# Patient Record
Sex: Female | Born: 1975 | Race: Black or African American | Hispanic: No | Marital: Single | Smoking: Current every day smoker
Health system: Southern US, Community
[De-identification: ages and names within clinical notes are randomized; demographics above are authoritative.]

## PROBLEM LIST (undated history)

## (undated) DIAGNOSIS — R569 Unspecified convulsions: Secondary | ICD-10-CM

## (undated) DIAGNOSIS — F1010 Alcohol abuse, uncomplicated: Secondary | ICD-10-CM

## (undated) DIAGNOSIS — K7460 Unspecified cirrhosis of liver: Secondary | ICD-10-CM

## (undated) DIAGNOSIS — J45909 Unspecified asthma, uncomplicated: Secondary | ICD-10-CM

---

## 2012-01-25 ENCOUNTER — Emergency Department (INDEPENDENT_AMBULATORY_CARE_PROVIDER_SITE_OTHER): Payer: Self-pay

## 2012-01-25 ENCOUNTER — Encounter (HOSPITAL_BASED_OUTPATIENT_CLINIC_OR_DEPARTMENT_OTHER): Payer: Self-pay | Admitting: *Deleted

## 2012-01-25 ENCOUNTER — Emergency Department (HOSPITAL_BASED_OUTPATIENT_CLINIC_OR_DEPARTMENT_OTHER)
Admission: EM | Admit: 2012-01-25 | Discharge: 2012-01-25 | Disposition: A | Discharge status: Home / Self Care | Payer: Self-pay | Attending: Emergency Medicine | Admitting: Emergency Medicine

## 2012-01-25 DIAGNOSIS — Z0389 Encounter for observation for other suspected diseases and conditions ruled out: Secondary | ICD-10-CM

## 2012-01-25 DIAGNOSIS — IMO0002 Reserved for concepts with insufficient information to code with codable children: Secondary | ICD-10-CM | POA: Insufficient documentation

## 2012-01-25 DIAGNOSIS — W268XXA Contact with other sharp object(s), not elsewhere classified, initial encounter: Secondary | ICD-10-CM | POA: Insufficient documentation

## 2012-01-25 DIAGNOSIS — S91309A Unspecified open wound, unspecified foot, initial encounter: Secondary | ICD-10-CM

## 2012-01-25 DIAGNOSIS — S90851A Superficial foreign body, right foot, initial encounter: Secondary | ICD-10-CM

## 2012-01-25 MED ORDER — LIDOCAINE HCL 2 % IJ SOLN: 5.0000 mL | Freq: Once | INTRAMUSCULAR | Status: DC

## 2012-01-25 MED ORDER — LIDOCAINE HCL (PF) 1 % IJ SOLN: 5.0000 mL | Freq: Once | INTRAMUSCULAR | Status: DC

## 2012-01-25 MED ORDER — LIDOCAINE HCL 2 % IJ SOLN
INTRAMUSCULAR | Status: AC
  Filled 2012-01-25: qty 1
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
## 2012-01-25 NOTE — ED Provider Notes (Signed)
Medical screening examination/treatment/procedure(s) were performed by non-physician practitioner and as supervising physician I was immediately available for consultation/collaboration.   Forbes Cellar, MD 01/25/12 [phone]

## 2012-01-25 NOTE — Discharge Instructions (Signed)
Foreign Body  A foreign body is something in your body that should not be there. This may have been caused by a puncture wound or other injury. Puncture wounds become easily infected. This happens when bacteria (germs) get under the skin. Rusty nails and similar foreign bodies are often dirty and carry germs on them.   TREATMENT    A foreign body is usually removed if this can be easily done right after it happens.   Sometimes they are left in and removed at a later surgery. They may be left in indefinitely if they will not cause later problems.   The following are general instructions in caring for your wound.  HOME CARE INSTRUCTIONS    A dressing, depending on the location of the wound, may have been applied. This may be changed once per day or as instructed. If the dressing sticks, it may be soaked off with soapy water or hydrogen peroxide.   Only take over-the-counter or prescription medicines for pain, discomfort, or fever as directed by your caregiver.   Be aware that your body will work to remove the foreign substance. That is, the foreign body may work itself out of the wound. That is normal.   You may have received a recommendation to follow up with your physician or a specialist. It is very important to call for or keep follow-up appointments in order to avoid infection or other complications.  SEEK IMMEDIATE MEDICAL CARE IF:    There is redness, swelling, or increasing pain in the wound.   You notice a foul smell coming from the wound or dressing.   Pus is coming from the wound.   An unexplained oral temperature above 102 F (38.9 C) develops, or as your caregiver suggests.   There is increasing pain in the wound.  If you did not receive a tetanus shot today because you did not recall when your last one was given, check with your caregiver's office and determine if one is needed. Generally for a "dirty" wound, you should receive a tetanus booster if you have not had one in the last five  years. If you have a "clean" wound, you should receive a tetanus booster if you have not had one within the last ten years.  If you have a foreign body that needs removal and this was not done today, make sure you know how you are to follow up and what is the plan of action for taking care of this. It is your responsibility to follow up on this.  MAKE SURE YOU:    Understand these instructions.   Will watch your condition.   Will get help right away if you are not doing well or get worse.  Document Released: 03/10/2001 Document Revised: 09/03/2011 Document Reviewed: 05/03/2008  [employer] Patient Information 2012 [employer].
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
## 2012-09-19 ENCOUNTER — Encounter (HOSPITAL_BASED_OUTPATIENT_CLINIC_OR_DEPARTMENT_OTHER): Payer: Self-pay | Admitting: *Deleted

## 2012-09-19 ENCOUNTER — Emergency Department (HOSPITAL_BASED_OUTPATIENT_CLINIC_OR_DEPARTMENT_OTHER)
Admission: EM | Admit: 2012-09-19 | Discharge: 2012-09-19 | Disposition: A | Discharge status: Home / Self Care | Payer: Self-pay | Attending: Emergency Medicine | Admitting: Emergency Medicine

## 2012-09-19 DIAGNOSIS — Y929 Unspecified place or not applicable: Secondary | ICD-10-CM | POA: Insufficient documentation

## 2012-09-19 DIAGNOSIS — W503XXA Accidental bite by another person, initial encounter: Secondary | ICD-10-CM

## 2012-09-19 DIAGNOSIS — Z23 Encounter for immunization: Secondary | ICD-10-CM | POA: Insufficient documentation

## 2012-09-19 DIAGNOSIS — W268XXA Contact with other sharp object(s), not elsewhere classified, initial encounter: Secondary | ICD-10-CM | POA: Insufficient documentation

## 2012-09-19 DIAGNOSIS — Y939 Activity, unspecified: Secondary | ICD-10-CM | POA: Insufficient documentation

## 2012-09-19 DIAGNOSIS — S0180XA Unspecified open wound of other part of head, initial encounter: Secondary | ICD-10-CM | POA: Insufficient documentation

## 2012-09-19 MED ORDER — AMOXICILLIN-POT CLAVULANATE 875-125 MG PO TABS: 1.0000 | ORAL_TABLET | Freq: Two times a day (BID) | ORAL | Status: DC

## 2012-09-19 MED ORDER — TETANUS-DIPHTH-ACELL PERTUSSIS 5-2.5-18.5 LF-MCG/0.5 IM SUSP
0.5000 mL | Freq: Once | INTRAMUSCULAR | Status: AC
  Administered 2012-09-19: 0.5 mL via INTRAMUSCULAR
  Filled 2012-09-19: qty 0.5
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
## 2012-09-24 NOTE — ED Provider Notes (Signed)
History/physical exam/procedure(s) were performed by non-physician practitioner and as supervising physician I was immediately available for consultation/collaboration. I have reviewed all notes and am in agreement with care and plan.   Hilario Quarry, MD 09/24/12 [phone]

## 2012-12-22 ENCOUNTER — Encounter (HOSPITAL_COMMUNITY): Payer: Self-pay | Admitting: Emergency Medicine

## 2012-12-22 ENCOUNTER — Emergency Department (HOSPITAL_COMMUNITY)
Admission: EM | Admit: 2012-12-22 | Discharge: 2012-12-22 | Disposition: A | Discharge status: Home / Self Care | Payer: Self-pay | Attending: Emergency Medicine | Admitting: Emergency Medicine

## 2012-12-22 DIAGNOSIS — T783XXA Angioneurotic edema, initial encounter: Secondary | ICD-10-CM | POA: Insufficient documentation

## 2012-12-22 DIAGNOSIS — I10 Essential (primary) hypertension: Secondary | ICD-10-CM | POA: Insufficient documentation

## 2012-12-22 MED ORDER — PREDNISONE 50 MG PO TABS: 50.0000 mg | ORAL_TABLET | Freq: Every day | ORAL | Status: DC

## 2012-12-22 MED ORDER — DIPHENHYDRAMINE HCL 50 MG/ML IJ SOLN
25.0000 mg | Freq: Once | INTRAMUSCULAR | Status: AC
  Administered 2012-12-22: 25 mg via INTRAVENOUS
  Filled 2012-12-22: qty 1

## 2012-12-22 MED ORDER — FAMOTIDINE 20 MG PO TABS: 20.0000 mg | ORAL_TABLET | Freq: Two times a day (BID) | ORAL | Status: DC

## 2012-12-22 MED ORDER — FAMOTIDINE IN NACL 20-0.9 MG/50ML-% IV SOLN
20.0000 mg | Freq: Once | INTRAVENOUS | Status: AC
  Administered 2012-12-22: 20 mg via INTRAVENOUS
  Filled 2012-12-22: qty 50

## 2012-12-22 MED ORDER — METHYLPREDNISOLONE SODIUM SUCC 125 MG IJ SOLR
125.0000 mg | Freq: Once | INTRAMUSCULAR | Status: AC
  Administered 2012-12-22: 125 mg via INTRAVENOUS
  Filled 2012-12-22: qty 2

## 2012-12-22 MED ORDER — DIPHENHYDRAMINE HCL 25 MG PO TABS: 50.0000 mg | ORAL_TABLET | Freq: Four times a day (QID) | ORAL | Status: DC

## 2012-12-22 NOTE — ED Notes (Signed)
Spoke with pt and family at bedside about plan of care for pt; pt understands that she will need to stay for at least 4 hours for observation per Dr Dellie Burns orders

## 2012-12-22 NOTE — ED Notes (Signed)
Pt given ice chips and explained why she can not have anything solid to eat at this time; pt states she understands why she can't eat and is ok with eating ice chips right now; pt alert and mentating appropriately; pt speaking in clear sentences; NAD noted at this time

## 2012-12-22 NOTE — ED Notes (Signed)
Pt given d/c teaching via Chrisandra Netters, RN; pt ambualtory leaving ED with d/c paperwork and prescriptions; pt instructed not to drive and endorses that she has a ride coming to pick her up from ED and will not be driving home; NAD notes upon d/c.

## 2012-12-22 NOTE — ED Provider Notes (Signed)
History     CSN: [id number]  Arrival date & time 12/22/12  1757   First MD Initiated Contact with Patient 12/22/12 1759      Chief Complaint  Patient presents with  . Angioedema    (Consider location/radiation/quality/duration/timing/severity/associated sxs/prior treatment) HPI Pt presenting with tongue swelling and difficulty speaking.  Pt states symptoms began earlier today.  She also states she took an unknown medicaiton to help her sleep and woke up with tongue swollen.  States she does not take any daily medications.  No hx of similar symptoms in the past.  No rash or itching.  No difficulty breathing or swallowing.  She was brought in by EMS, has had no treatment prior to arrival. There are no other associated systemic symptoms, there are no other alleviating or modifying factors.   History reviewed. No pertinent past medical history.  Past Surgical History  Procedure Laterality Date  . Cesarean section      History reviewed. No pertinent family history.  History  Substance Use Topics  . Smoking status: Never Smoker   . Smokeless tobacco: Not on file  . Alcohol Use: Yes    OB History   Grav Para Term Preterm Abortions TAB SAB Ect Mult Living                  Review of Systems ROS reviewed and all otherwise negative except for mentioned in HPI  Allergies  Review of patient's allergies indicates no known allergies.  Home Medications   Current Outpatient Rx  Name  Route  Sig  Dispense  Refill  . amoxicillin-clavulanate (AUGMENTIN) 875-125 MG per tablet   Oral   Take 1 tablet by mouth every 12 (twelve) hours.   10 tablet   0   . diphenhydrAMINE (BENADRYL) 25 MG tablet   Oral   Take 2 tablets (50 mg total) by mouth every 6 (six) hours. Take 1-2 tablets every 6 hours x 2 days, then space out to an as needed basis   20 tablet   0   . famotidine (PEPCID) 20 MG tablet   Oral   Take 1 tablet (20 mg total) by mouth 2 (two) times daily.   30 tablet   0    . predniSONE (DELTASONE) 50 MG tablet   Oral   Take 1 tablet (50 mg total) by mouth daily.   10 tablet   0     BP 127/76  Pulse 98  Temp(Src) 98.6 F (37 C) (Oral)  Resp 20  SpO2 100% Vitals reviewed Physical Exam Physical Examination: General appearance - alert, well appearing, and in no distress Mental status - alert, oriented to person, place, and time Eyes - no conjunctival injection, no scleral icterus Mouth - mucous membranes moist, pharynx normal without lesions Neck - supple, no significant adenopathy Chest - clear to auscultation, no wheezes, rales or rhonchi, symmetric air entry Heart - normal rate, regular rhythm, normal S1, S2, no murmurs, rubs, clicks or gallops Abdomen - soft, nontender, nondistended, no masses or organomegaly Extremities - peripheral pulses normal, no pedal edema, no clubbing or cyanosis Skin - normal coloration and turgor, no rashes  ED Course  Procedures (including critical care time)  6:53 PM pt is already having some improvement in her symptoms.  Speech is more clear and she feels more comfortable.   8:39 PM pt continues to feel improvement, talking and breathing easily  Labs Reviewed - No data to display No results found.   1. Angioedema,  initial encounter       MDM  Pt presents with c/o tongue swelling, exam c/w mild angioedema- pt responded to IV meds and was observed in the ED for several hours afterward without recurrence of symptoms.  Discharged to continue steroids, benadryl, pepcid.  Discharged with strict return precautions.  Pt agreeable with plan.        Ethelda Chick, MD 12/24/12 [phone]

## 2012-12-22 NOTE — ED Notes (Signed)
Pt able to speak in clear sentences at the moment; pt states she is able to speak easier now; pt denies dizziness and lightheartedness; pt denies numbness and tingling; pt denies blurred vision and headache; pt alert and mentating appropriately
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
## 2012-12-22 NOTE — ED Notes (Signed)
Pt resting in bed; pt denies difficulty breathing; pt denies difficulty speaking; pt states her throat feels scratchy; NAD noted at this time

## 2013-05-28 ENCOUNTER — Emergency Department (HOSPITAL_COMMUNITY)
Admission: EM | Admit: 2013-05-28 | Discharge: 2013-05-29 | Payer: Self-pay | Attending: Emergency Medicine | Admitting: Emergency Medicine

## 2013-05-28 ENCOUNTER — Encounter (HOSPITAL_COMMUNITY): Payer: Self-pay

## 2013-05-28 DIAGNOSIS — R569 Unspecified convulsions: Secondary | ICD-10-CM

## 2013-05-28 DIAGNOSIS — Z3202 Encounter for pregnancy test, result negative: Secondary | ICD-10-CM | POA: Insufficient documentation

## 2013-05-28 DIAGNOSIS — G40909 Epilepsy, unspecified, not intractable, without status epilepticus: Secondary | ICD-10-CM | POA: Insufficient documentation

## 2013-05-28 HISTORY — DX: Unspecified convulsions: R56.9

## 2013-05-28 HISTORY — DX: Unspecified asthma, uncomplicated: J45.909

## 2013-05-28 LAB — POCT I-STAT, CHEM 8
Calcium, Ion: 1.15 mmol/L (ref 1.12–1.23)
Chloride: 103 mEq/L (ref 96–112)
HCT: 30 % — ABNORMAL LOW (ref 36.0–46.0)

## 2013-05-28 MED ORDER — ONDANSETRON 4 MG PO TBDP
4.0000 mg | ORAL_TABLET | Freq: Once | ORAL | Status: AC
  Administered 2013-05-28: 4 mg via ORAL
  Filled 2013-05-28: qty 1

## 2013-05-28 MED ORDER — LORAZEPAM 2 MG/ML IJ SOLN
INTRAMUSCULAR | Status: AC
  Administered 2013-05-28: 2 mg
  Filled 2013-05-28: qty 1

## 2013-05-28 MED ORDER — SODIUM CHLORIDE 0.9 % IV SOLN
1000.0000 mg | Freq: Once | INTRAVENOUS | Status: AC
  Administered 2013-05-28: 1000 mg via INTRAVENOUS
  Filled 2013-05-28: qty 20
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
## 2013-05-28 NOTE — ED Notes (Signed)
Pt with episode of unresponsiveness.  Pt noted to be shaking with eyes close.  Eye movement noted.  This RN notified of pt's activity, EDP made aware.  Pt currently responsive with eyes open and stating she is "not feeling well".  VSS.

## 2013-05-28 NOTE — ED Notes (Signed)
Pt with confusion.  Sts she does not recall episode of shaking and twitching.

## 2013-05-28 NOTE — ED Notes (Signed)
Dr.Camilo at bedside  

## 2013-05-28 NOTE — ED Notes (Signed)
2312 - Pt started to shaking in bed and become unresponsive.  Pt moaning and turning her head away from ammonia capsule.  HR increased to the 120's, O2 sats 100%, airway intact.  Orson Slick, PA and Fredderick Phenix, MD made aware of seizure like activity.  Verbal order given for 2 mg of Ativan.  Ativan given at 2314.  Pt still shaking and nonverbal.  Dilantin continued.  Neuro to be consulted.  Pt stopped seizure-like activity at 2321.  HR back in the 90's.

## 2013-05-28 NOTE — ED Provider Notes (Signed)
CSN: [id number]     Arrival date & time 05/28/13  2128 History   First MD Initiated Contact with Patient 05/28/13 2142     Chief Complaint  Patient presents with  . Seizures   HPI  History provided by the patient and [employer] Patient is a 36 year old female who reports a history of seizure disorder and presents after having 2 seizures in jail. Patient has been in jail for the past week. She does not recall the events but was reportedly found having contractures similar to a seizure lasting approximately 15 minutes. There was a temporary break followed by some confusion and then patient had a second seizure as reported by the [employer]. Patient was given 5 mg of Versed which stopped a second seizure. Patient has been stable with good airway since that time. Symptoms were associated with postictal confusion. There was no urinary or fecal incontinence. Patient did fall down and complains of some pain and soreness around her left forehead. She denies any pain or injuries to the extremities. Denies neck or back pain. Currently she feels improved without much complaint. No other treatments provided. Patient states she used to be on seizure medication but has not taken for over a year. Her last seizure was in February. No other aggravating or alleviating factors. No other associated symptoms.     Past Medical History  Diagnosis Date  . Seizures    Past Surgical History  Procedure Laterality Date  . Cesarean section     History reviewed. No pertinent family history. History  Substance Use Topics  . Smoking status: Never Smoker   . Smokeless tobacco: Not on file  . Alcohol Use: Yes   OB History   Grav Para Term Preterm Abortions TAB SAB Ect Mult Living                 Review of Systems  Constitutional: Negative for fever.  HENT: Negative for neck pain.   Eyes: Negative for visual disturbance.  Respiratory: Negative for cough and shortness of breath.   Gastrointestinal:  Negative for nausea, vomiting, abdominal pain and diarrhea.  Genitourinary: Negative for dysuria.  Musculoskeletal: Negative for back pain.  Neurological: Positive for headaches. Negative for dizziness and light-headedness.  All other systems reviewed and are negative.    Allergies  Review of patient's allergies indicates no known allergies.  Home Medications   Current Outpatient Rx  Name  Route  Sig  Dispense  Refill  . amoxicillin-clavulanate (AUGMENTIN) 875-125 MG per tablet   Oral   Take 1 tablet by mouth every 12 (twelve) hours.   10 tablet   0   . diphenhydrAMINE (BENADRYL) 25 MG tablet   Oral   Take 2 tablets (50 mg total) by mouth every 6 (six) hours. Take 1-2 tablets every 6 hours x 2 days, then space out to an as needed basis   20 tablet   0   . famotidine (PEPCID) 20 MG tablet   Oral   Take 1 tablet (20 mg total) by mouth 2 (two) times daily.   30 tablet   0   . predniSONE (DELTASONE) 50 MG tablet   Oral   Take 1 tablet (50 mg total) by mouth daily.   10 tablet   0    BP 122/71  Pulse 77  Temp(Src) 99.5 F (37.5 C) (Oral)  SpO2 100% Physical Exam  Nursing note and vitals reviewed. Constitutional: She is oriented to person, place, and time. She appears well-developed  and well-nourished. No distress.  HENT:  Head: Normocephalic.  Nose: Nose normal.  Mouth/Throat: Oropharynx is clear and moist.  Mild TTP over left forehead.  No significant hematoma.  No damage of skin.  No step offs.   No bite marks the tongue. Small possible minor bite mark to the right upper lip without bleeding or gaping wound. Dentition intact without acute injury.  Eyes: Conjunctivae and EOM are normal. Pupils are equal, round, and reactive to light.  Neck: Normal range of motion. Neck supple.  No meningeal signs  Cardiovascular: Normal rate and regular rhythm.   No murmur heard. Pulmonary/Chest: Effort normal and breath sounds normal. No respiratory distress. She has no  wheezes. She has no rales.  Abdominal: Soft. There is no tenderness. There is no rigidity, no rebound, no guarding, no CVA tenderness and no tenderness at McBurney's point.  Musculoskeletal: Normal range of motion. She exhibits no edema and no tenderness.       Cervical back: Normal.       Thoracic back: Normal.       Lumbar back: Normal.  Neurological: She is alert and oriented to person, place, and time. She has normal strength. No cranial nerve deficit or sensory deficit. Gait normal.  Skin: Skin is warm and dry. No rash noted.  Psychiatric: She has a normal mood and affect. Her behavior is normal.    ED Course  Procedures   Results for orders placed during the hospital encounter of 05/28/13  URINALYSIS, ROUTINE W REFLEX MICROSCOPIC      Result Value Range   Color, Urine YELLOW  YELLOW   APPearance CLOUDY (*) CLEAR   Specific Gravity, Urine 1.014  1.005 - 1.030   pH 7.0  5.0 - 8.0   Glucose, UA NEGATIVE  NEGATIVE mg/dL   Hgb urine dipstick NEGATIVE  NEGATIVE   Bilirubin Urine NEGATIVE  NEGATIVE   Ketones, ur 15 (*) NEGATIVE mg/dL   Protein, ur NEGATIVE  NEGATIVE mg/dL   Urobilinogen, UA 0.2  0.0 - 1.0 mg/dL   Nitrite NEGATIVE  NEGATIVE   Leukocytes, UA SMALL (*) NEGATIVE  URINE MICROSCOPIC-ADD ON      Result Value Range   Squamous Epithelial / LPF FEW (*) RARE   WBC, UA 3-6  <3 WBC/hpf   RBC / HPF 0-2  <3 RBC/hpf   Bacteria, UA FEW (*) RARE   Casts HYALINE CASTS (*) NEGATIVE   Urine-Other MUCOUS PRESENT    POCT I-STAT, CHEM 8      Result Value Range   Sodium 139  135 - 145 mEq/L   Potassium 4.1  3.5 - 5.1 mEq/L   Chloride 103  96 - 112 mEq/L   BUN 5 (*) 6 - 23 mg/dL   Creatinine, Ser 9.14  0.50 - 1.10 mg/dL   Glucose, Bld 86  70 - 99 mg/dL   Calcium, Ion 7.82  9.56 - 1.23 mmol/L   TCO2 26  0 - 100 mmol/L   Hemoglobin 10.2 (*) 12.0 - 15.0 g/dL   HCT 21.3 (*) 08.6 - 57.8 %  POCT PREGNANCY, URINE      Result Value Range   Preg Test, Ur NEGATIVE  NEGATIVE      Imaging Review No results found.  MDM   1. Seizure     9:50PM patient seen and evaluated. Patient well-appearing, awake and alert x3. Reports slight left headache and tenderness to the forehead.  Patient discussed with attending physician. Patient reports no history of seizure disorder  but unsure of medications. She does not recognize the names of Keppra, Dilantin, or Depakote. She does report a prior history of having a procedure described similar to an EEG. Currently we'll plan to start patient on Dilantin.  Spoke with Revonda Standard with pharmacy.  Will give loading dose of 1,000mg  IV Dilantin and maintenance dose of 300 mg extended release at bedtime.  11:20PM nursing four-minute patient having seizure-like activity. Upon entering the room patient is not responding verbally. Eyes are open without fasciculations. If she is having slight fasciculations and shaking of the upper extremities. Heart rate increased into the 120s. Hands are slightly clammy. Fasciculations in extremities do seem to go away withholding extremity. Return with letting go.  Pt also averts head to smelling salt.  Attending physician also in to see pt. patient without any immediate response to 2 mg Ativan. Total episode lasting for 5 minutes. 02 sats maintained. Patient did not seem to have significant postictal confusion.  Spoke with Dr.Camilo with neurology. He will be to see patient.  Dr. Leroy Kennedy has seen and evaluated pt.  He feels pts hx is questionable about seizure.  He agrees with plan to finish Dilantin and continue 300 mg at night.  He feels she is safe to return to jail.  I will also plan to give neurology referral.   Angus Seller, PA-C 05/29/13 [id number]

## 2013-05-29 DIAGNOSIS — R569 Unspecified convulsions: Secondary | ICD-10-CM

## 2013-05-29 LAB — URINALYSIS, ROUTINE W REFLEX MICROSCOPIC
Ketones, ur: 15 mg/dL — AB
Nitrite: NEGATIVE
Specific Gravity, Urine: 1.014 (ref 1.005–1.030)
pH: 7 (ref 5.0–8.0)

## 2013-05-29 LAB — POCT PREGNANCY, URINE: Preg Test, Ur: NEGATIVE

## 2013-05-29 LAB — URINE MICROSCOPIC-ADD ON

## 2013-05-29 MED ORDER — PHENYTOIN SODIUM EXTENDED 300 MG PO CAPS: 300.0000 mg | ORAL_CAPSULE | Freq: Every day | ORAL | Status: DC

## 2013-05-29 NOTE — ED Notes (Signed)
Water given to pt.  Tolerating PO fluids.

## 2013-05-30 LAB — URINE CULTURE: Colony Count: 55000

## 2013-05-30 NOTE — ED Provider Notes (Signed)
Medical screening examination/treatment/procedure(s) were conducted as a shared visit with non-physician practitioner(s) and myself.  I personally evaluated the patient during the encounter Pt seen with seizure activity.  Seizure noted in ED, but suspicious for pseudoseizure.  Neuro consulted.  Rolan Bucco, MD 05/30/13 1409

## 2021-08-06 ENCOUNTER — Encounter (HOSPITAL_BASED_OUTPATIENT_CLINIC_OR_DEPARTMENT_OTHER): Payer: Self-pay | Admitting: *Deleted

## 2021-08-06 ENCOUNTER — Emergency Department (HOSPITAL_BASED_OUTPATIENT_CLINIC_OR_DEPARTMENT_OTHER): Payer: Self-pay

## 2021-08-06 ENCOUNTER — Other Ambulatory Visit: Payer: Self-pay

## 2021-08-06 ENCOUNTER — Inpatient Hospital Stay (HOSPITAL_BASED_OUTPATIENT_CLINIC_OR_DEPARTMENT_OTHER)
Admission: EM | Admit: 2021-08-06 | Discharge: 2021-08-16 | DRG: 371 | Disposition: A | Discharge status: Home / Self Care | Payer: Self-pay | Attending: Internal Medicine | Admitting: Internal Medicine

## 2021-08-06 DIAGNOSIS — F10229 Alcohol dependence with intoxication, unspecified: Secondary | ICD-10-CM | POA: Diagnosis present

## 2021-08-06 DIAGNOSIS — G9341 Metabolic encephalopathy: Secondary | ICD-10-CM | POA: Diagnosis present

## 2021-08-06 DIAGNOSIS — D509 Iron deficiency anemia, unspecified: Secondary | ICD-10-CM | POA: Diagnosis present

## 2021-08-06 DIAGNOSIS — A0472 Enterocolitis due to Clostridium difficile, not specified as recurrent: Principal | ICD-10-CM | POA: Diagnosis present

## 2021-08-06 DIAGNOSIS — G40909 Epilepsy, unspecified, not intractable, without status epilepticus: Secondary | ICD-10-CM | POA: Diagnosis present

## 2021-08-06 DIAGNOSIS — E860 Dehydration: Secondary | ICD-10-CM | POA: Diagnosis present

## 2021-08-06 DIAGNOSIS — F1010 Alcohol abuse, uncomplicated: Secondary | ICD-10-CM | POA: Diagnosis present

## 2021-08-06 DIAGNOSIS — Z781 Physical restraint status: Secondary | ICD-10-CM

## 2021-08-06 DIAGNOSIS — R9431 Abnormal electrocardiogram [ECG] [EKG]: Secondary | ICD-10-CM

## 2021-08-06 DIAGNOSIS — Y908 Blood alcohol level of 240 mg/100 ml or more: Secondary | ICD-10-CM | POA: Diagnosis present

## 2021-08-06 DIAGNOSIS — E872 Acidosis, unspecified: Secondary | ICD-10-CM | POA: Diagnosis present

## 2021-08-06 DIAGNOSIS — E876 Hypokalemia: Secondary | ICD-10-CM | POA: Diagnosis present

## 2021-08-06 DIAGNOSIS — R7401 Elevation of levels of liver transaminase levels: Secondary | ICD-10-CM | POA: Diagnosis present

## 2021-08-06 DIAGNOSIS — I110 Hypertensive heart disease with heart failure: Secondary | ICD-10-CM | POA: Diagnosis present

## 2021-08-06 DIAGNOSIS — Z79899 Other long term (current) drug therapy: Secondary | ICD-10-CM

## 2021-08-06 DIAGNOSIS — J45909 Unspecified asthma, uncomplicated: Secondary | ICD-10-CM | POA: Diagnosis present

## 2021-08-06 DIAGNOSIS — Z20822 Contact with and (suspected) exposure to covid-19: Secondary | ICD-10-CM | POA: Diagnosis present

## 2021-08-06 DIAGNOSIS — I252 Old myocardial infarction: Secondary | ICD-10-CM

## 2021-08-06 DIAGNOSIS — E538 Deficiency of other specified B group vitamins: Secondary | ICD-10-CM | POA: Diagnosis present

## 2021-08-06 DIAGNOSIS — F17210 Nicotine dependence, cigarettes, uncomplicated: Secondary | ICD-10-CM | POA: Diagnosis present

## 2021-08-06 DIAGNOSIS — F10939 Alcohol use, unspecified with withdrawal, unspecified: Secondary | ICD-10-CM | POA: Diagnosis present

## 2021-08-06 DIAGNOSIS — I2510 Atherosclerotic heart disease of native coronary artery without angina pectoris: Secondary | ICD-10-CM | POA: Diagnosis present

## 2021-08-06 DIAGNOSIS — K7010 Alcoholic hepatitis without ascites: Secondary | ICD-10-CM | POA: Diagnosis present

## 2021-08-06 DIAGNOSIS — K529 Noninfective gastroenteritis and colitis, unspecified: Secondary | ICD-10-CM | POA: Diagnosis present

## 2021-08-06 DIAGNOSIS — K5100 Ulcerative (chronic) pancolitis without complications: Principal | ICD-10-CM | POA: Diagnosis present

## 2021-08-06 DIAGNOSIS — K279 Peptic ulcer, site unspecified, unspecified as acute or chronic, without hemorrhage or perforation: Secondary | ICD-10-CM | POA: Diagnosis present

## 2021-08-06 DIAGNOSIS — I509 Heart failure, unspecified: Secondary | ICD-10-CM | POA: Diagnosis present

## 2021-08-06 DIAGNOSIS — E162 Hypoglycemia, unspecified: Secondary | ICD-10-CM | POA: Diagnosis present

## 2021-08-06 DIAGNOSIS — F10239 Alcohol dependence with withdrawal, unspecified: Secondary | ICD-10-CM | POA: Diagnosis not present

## 2021-08-06 LAB — BLOOD GAS, VENOUS
Acid-Base Excess: 1.9 mmol/L (ref 0.0–2.0)
Bicarbonate: 26.9 mmol/L (ref 20.0–28.0)
O2 Saturation: 65.3 %
Patient temperature: 98.6
pCO2, Ven: 47.2 mmHg (ref 44.0–60.0)
pH, Ven: 7.375 (ref 7.250–7.430)
pO2, Ven: 45.2 mmHg — ABNORMAL HIGH (ref 32.0–45.0)

## 2021-08-06 LAB — CBC
HCT: 33.6 % — ABNORMAL LOW (ref 36.0–46.0)
Hemoglobin: 10.6 g/dL — ABNORMAL LOW (ref 12.0–15.0)
MCH: 22.9 pg — ABNORMAL LOW (ref 26.0–34.0)
MCHC: 31.5 g/dL (ref 30.0–36.0)
MCV: 72.7 fL — ABNORMAL LOW (ref 80.0–100.0)
Platelets: 235 10*3/uL (ref 150–400)
RBC: 4.62 MIL/uL (ref 3.87–5.11)
RDW: 23.2 % — ABNORMAL HIGH (ref 11.5–15.5)
WBC: 7 10*3/uL (ref 4.0–10.5)
nRBC: 0 % (ref 0.0–0.2)

## 2021-08-06 LAB — COMPREHENSIVE METABOLIC PANEL
ALT: 16 U/L (ref 0–44)
AST: 70 U/L — ABNORMAL HIGH (ref 15–41)
Albumin: 2.7 g/dL — ABNORMAL LOW (ref 3.5–5.0)
Alkaline Phosphatase: 149 U/L — ABNORMAL HIGH (ref 38–126)
Anion gap: 18 — ABNORMAL HIGH (ref 5–15)
BUN: 6 mg/dL (ref 6–20)
CO2: 26 mmol/L (ref 22–32)
Calcium: 7.9 mg/dL — ABNORMAL LOW (ref 8.9–10.3)
Chloride: 96 mmol/L — ABNORMAL LOW (ref 98–111)
Creatinine, Ser: 0.62 mg/dL (ref 0.44–1.00)
GFR, Estimated: >60 mL/min (ref >60)
Glucose, Bld: 79 mg/dL (ref 70–99)
Potassium: 2.5 mmol/L — CL (ref 3.5–5.1)
Sodium: 140 mmol/L (ref 135–145)
Total Bilirubin: 1.1 mg/dL (ref 0.3–1.2)
Total Protein: 7 g/dL (ref 6.5–8.1)

## 2021-08-06 LAB — I-STAT VENOUS BLOOD GAS, ED
Acid-Base Excess: 2 mmol/L (ref 0.0–2.0)
Bicarbonate: 26.8 mmol/L (ref 20.0–28.0)
Calcium, Ion: 0.94 mmol/L — ABNORMAL LOW (ref 1.15–1.40)
HCT: 32 % — ABNORMAL LOW (ref 36.0–46.0)
Hemoglobin: 10.9 g/dL — ABNORMAL LOW (ref 12.0–15.0)
O2 Saturation: 99 %
Patient temperature: 99.4
Potassium: 2.2 mmol/L — CL (ref 3.5–5.1)
Sodium: 141 mmol/L (ref 135–145)
TCO2: 28 mmol/L (ref 22–32)
pCO2, Ven: 40.6 mmHg — ABNORMAL LOW (ref 44.0–60.0)
pH, Ven: 7.429 (ref 7.250–7.430)
pO2, Ven: 153 mmHg — ABNORMAL HIGH (ref 32.0–45.0)

## 2021-08-06 LAB — URINALYSIS, ROUTINE W REFLEX MICROSCOPIC
Bilirubin Urine: NEGATIVE
Glucose, UA: NEGATIVE mg/dL
Ketones, ur: NEGATIVE mg/dL
Leukocytes,Ua: NEGATIVE
Nitrite: NEGATIVE
Specific Gravity, Urine: 1.01 (ref 1.005–1.030)
pH: 6 (ref 5.0–8.0)

## 2021-08-06 LAB — HCG, QUANTITATIVE, PREGNANCY: hCG, Beta Chain, Quant, S: <1 m[IU]/mL (ref <5)

## 2021-08-06 LAB — BASIC METABOLIC PANEL
Anion gap: 17 — ABNORMAL HIGH (ref 5–15)
BUN: 5 mg/dL — ABNORMAL LOW (ref 6–20)
CO2: 23 mmol/L (ref 22–32)
Calcium: 7.3 mg/dL — ABNORMAL LOW (ref 8.9–10.3)
Chloride: 98 mmol/L (ref 98–111)
Creatinine, Ser: 0.64 mg/dL (ref 0.44–1.00)
GFR, Estimated: >60 mL/min (ref >60)
Glucose, Bld: 92 mg/dL (ref 70–99)
Potassium: 2.3 mmol/L — CL (ref 3.5–5.1)
Sodium: 138 mmol/L (ref 135–145)

## 2021-08-06 LAB — URINALYSIS, MICROSCOPIC (REFLEX)

## 2021-08-06 LAB — RESP PANEL BY RT-PCR (FLU A&B, COVID) ARPGX2
Influenza A by PCR: NEGATIVE
Influenza B by PCR: NEGATIVE
SARS Coronavirus 2 by RT PCR: NEGATIVE

## 2021-08-06 LAB — CBG MONITORING, ED
Glucose-Capillary: 56 mg/dL — ABNORMAL LOW (ref 70–99)
Glucose-Capillary: 67 mg/dL — ABNORMAL LOW (ref 70–99)
Glucose-Capillary: 109 mg/dL — ABNORMAL HIGH (ref 70–99)
Glucose-Capillary: 74 mg/dL (ref 70–99)

## 2021-08-06 LAB — ETHANOL: Alcohol, Ethyl (B): 247 mg/dL — ABNORMAL HIGH (ref <10)

## 2021-08-06 LAB — LACTIC ACID, PLASMA
Lactic Acid, Venous: 7.5 mmol/L (ref 0.5–1.9)
Lactic Acid, Venous: 7.1 mmol/L (ref 0.5–1.9)

## 2021-08-06 LAB — TROPONIN I (HIGH SENSITIVITY)
Troponin I (High Sensitivity): 8 ng/L (ref <18)
Troponin I (High Sensitivity): 11 ng/L (ref <18)

## 2021-08-06 LAB — MAGNESIUM: Magnesium: 1.2 mg/dL — ABNORMAL LOW (ref 1.7–2.4)

## 2021-08-06 LAB — LIPASE, BLOOD: Lipase: 28 U/L (ref 11–51)

## 2021-08-06 LAB — BRAIN NATRIURETIC PEPTIDE: B Natriuretic Peptide: 84.7 pg/mL (ref 0.0–100.0)

## 2021-08-06 MED ORDER — MORPHINE SULFATE (PF) 4 MG/ML IV SOLN
4.0000 mg | Freq: Once | INTRAVENOUS | Status: AC
  Administered 2021-08-06: 4 mg via INTRAVENOUS
  Filled 2021-08-06: qty 1

## 2021-08-06 MED ORDER — DEXTROSE 50 % IV SOLN: 50.0000 mL | Freq: Once | INTRAVENOUS | Status: DC

## 2021-08-06 MED ORDER — SODIUM CHLORIDE 0.9 % IV BOLUS
1000.0000 mL | Freq: Once | INTRAVENOUS | Status: AC
  Administered 2021-08-06: 1000 mL via INTRAVENOUS

## 2021-08-06 MED ORDER — POTASSIUM CHLORIDE 10 MEQ/100ML IV SOLN
10.0000 meq | INTRAVENOUS | Status: AC
  Administered 2021-08-06 – 2021-08-07 (×5): 10 meq via INTRAVENOUS
  Filled 2021-08-06 (×5): qty 100

## 2021-08-06 MED ORDER — ONDANSETRON 4 MG PO TBDP: 4.0000 mg | ORAL_TABLET | Freq: Once | ORAL | Status: DC

## 2021-08-06 MED ORDER — DEXTROSE 10 % IV SOLN: INTRAVENOUS | Status: DC

## 2021-08-06 MED ORDER — METRONIDAZOLE 500 MG/100ML IV SOLN
500.0000 mg | Freq: Once | INTRAVENOUS | Status: AC
  Administered 2021-08-06: 500 mg via INTRAVENOUS
  Filled 2021-08-06: qty 100

## 2021-08-06 MED ORDER — DEXTROSE 50 % IV SOLN
1.0000 | Freq: Once | INTRAVENOUS | Status: AC
  Administered 2021-08-06: 50 mL via INTRAVENOUS
  Filled 2021-08-06: qty 50

## 2021-08-06 MED ORDER — KCL IN DEXTROSE-NACL 20-5-0.45 MEQ/L-%-% IV SOLN
Freq: Once | INTRAVENOUS | Status: DC
  Filled 2021-08-06: qty 1000

## 2021-08-06 MED ORDER — CIPROFLOXACIN IN D5W 400 MG/200ML IV SOLN
400.0000 mg | Freq: Once | INTRAVENOUS | Status: AC
  Administered 2021-08-06: 400 mg via INTRAVENOUS
  Filled 2021-08-06: qty 200

## 2021-08-06 MED ORDER — ONDANSETRON HCL 4 MG/2ML IJ SOLN
INTRAMUSCULAR | Status: AC
  Administered 2021-08-06: 4 mg
  Filled 2021-08-06: qty 2

## 2021-08-06 MED ORDER — IOHEXOL 350 MG/ML SOLN
100.0000 mL | Freq: Once | INTRAVENOUS | Status: AC | PRN
  Administered 2021-08-06: 100 mL via INTRAVENOUS

## 2021-08-06 MED ORDER — MAGNESIUM SULFATE 2 GM/50ML IV SOLN
2.0000 g | Freq: Once | INTRAVENOUS | Status: AC
  Administered 2021-08-06: 2 g via INTRAVENOUS
  Filled 2021-08-06: qty 50

## 2021-08-06 MED ORDER — POTASSIUM CHLORIDE 10 MEQ/100ML IV SOLN
10.0000 meq | INTRAVENOUS | Status: AC
  Administered 2021-08-06: 10 meq via INTRAVENOUS
  Filled 2021-08-06 (×2): qty 100

## 2021-08-06 MED ORDER — LEVETIRACETAM IN NACL 1000 MG/100ML IV SOLN
1000.0000 mg | Freq: Once | INTRAVENOUS | Status: AC
  Administered 2021-08-06: 1000 mg via INTRAVENOUS
  Filled 2021-08-06: qty 100

## 2021-08-06 MED ORDER — POTASSIUM CHLORIDE CRYS ER 20 MEQ PO TBCR
40.0000 meq | EXTENDED_RELEASE_TABLET | Freq: Once | ORAL | Status: DC
  Filled 2021-08-06: qty 2

## 2021-08-06 MED ORDER — PANTOPRAZOLE SODIUM 40 MG IV SOLR
40.0000 mg | Freq: Once | INTRAVENOUS | Status: AC
  Administered 2021-08-06: 40 mg via INTRAVENOUS
  Filled 2021-08-06: qty 40

## 2021-08-06 NOTE — ED Triage Notes (Addendum)
C/o body aches , fever, nasal congestion , h/a x 1 week, c/o chest wall pain  with pro cough , pt is here with family that is also being seen for same DX flu A

## 2021-08-06 NOTE — ED Provider Notes (Signed)
[facility] EMERGENCY DEPARTMENT Provider Note   CSN: [id number] Arrival date & time: 08/06/21  1409     History Chief Complaint  Patient presents with   Generalized Body Aches    Brandi Romero is a 45 y.o. female smoker with h/o asthma, seizures, and "a bad heart" presents emergency department for 1 week of flulike symptoms.  The patient mentions that she had substernal chest pain and pressure constantly since 2200 yesterday with no exacerbating relieving factors.  Reports she has nitroglycerin but did not take it.  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  She reports she had shortness of breath since last night as well.  Reports has nasal congestion, rhinorrhea, sneezing, cough, and sore throat.  Patiently, she endorses generalized abdominal pain with nausea and vomiting.  Denies any diarrhea constipation.  Denies any dark or tarry stools.  Patient reports she is doing well on her medical problems.  She does not remember all of her medications.  Reports occasional compliance to meds.  No known drug allergies.  Patient daily drinker.  Mentions her last drink was a few glasses of wine last night.  Patient does have a history of seizures, denies any seizures or seizure-like activity.  Patient reports she takes Keppra, our record say Dilantin.  She doesn't remember the last time she took it.  The patient is an extremely poor historian and is not cooperative during exam.   HPI     Past Medical History:  Diagnosis Date   Asthma    Seizures ([place])     Patient Active Problem List   Diagnosis Date Noted   Pancolitis ([place]) 08/06/2021    Past Surgical History:  Procedure Laterality Date   CESAREAN SECTION       OB History   No obstetric history on file.     No family history on file.  Social History   Tobacco Use   Smoking status: Every Day    Types: Cigarettes  Substance Use Topics   Alcohol use: Yes   Drug use: Yes     Types: Marijuana    Home Medications Prior to Admission medications   Medication Sig Start Date End Date Taking? Authorizing Provider  diphenhydrAMINE (BENADRYL) 25 MG tablet Take 2 tablets (50 mg total) by mouth every 6 (six) hours. Take 1-2 tablets every 6 hours x 2 days, then space out to an as needed basis 12/22/12   Pixie Casino, MD  famotidine (PEPCID) 20 MG tablet Take 1 tablet (20 mg total) by mouth 2 (two) times daily. 12/22/12   Mabe, Forbes Cellar, MD  phenytoin (DILANTIN) 300 MG ER capsule Take 1 capsule (300 mg total) by mouth at bedtime. 05/29/13   Hazel Sams, PA-C  predniSONE (DELTASONE) 50 MG tablet Take 1 tablet (50 mg total) by mouth daily. 12/22/12   Mabe, Forbes Cellar, MD    Allergies    Patient has no known allergies.  Review of Systems   Review of Systems  Constitutional:  Positive for fatigue. Negative for chills and fever.  HENT:  Positive for congestion, rhinorrhea, sneezing and sore throat. Negative for ear pain.   Eyes:  Negative for pain and visual disturbance.  Respiratory:  Positive for cough and shortness of breath.   Cardiovascular:  Positive for chest pain. Negative for palpitations.  Gastrointestinal:  Positive for abdominal pain, nausea and vomiting. Negative for constipation and diarrhea.  Genitourinary:  Negative for dysuria, hematuria, vaginal  bleeding and vaginal discharge.  Musculoskeletal:  Negative for arthralgias and back pain.  Skin:  Negative for color change and rash.  Neurological:  Negative for dizziness, seizures, syncope, weakness, light-headedness and headaches.  All other systems reviewed and are negative.  Physical Exam Updated Vital Signs BP 116/82 (BP Location: Left Arm)   Pulse 81   Temp 98.4 F (36.9 C) (Oral)   Resp 19   Ht _0  (1.651 m)   Wt 59 kg   SpO2 98%   BMI 21.63 kg/m   Physical Exam Vitals and nursing note reviewed.  Constitutional:      General: She is not in acute distress.    Appearance: Normal appearance.  She is not toxic-appearing.  HENT:     Right Ear: Tympanic membrane, ear canal and external ear normal.     Left Ear: Tympanic membrane, ear canal and external ear normal.     Nose:     Comments: bilateral turbinate edema and erythema with scant clear nasal discharge.    Mouth/Throat:     Mouth: Mucous membranes are moist.     Pharynx: No oropharyngeal exudate or posterior oropharyngeal erythema.  Eyes:     General: No scleral icterus. Cardiovascular:     Rate and Rhythm: Normal rate and regular rhythm.     Comments: Bilateral upper and lower extremity pulses intact. Pulmonary:     Effort: Pulmonary effort is normal. No respiratory distress.     Breath sounds: Normal breath sounds. No wheezing.     Comments: Clear to auscultation bilaterally.  No respiratory distress, accessory muscle use, tripoding, nasal flaring, or cyanosis present.  Patient speaking in full sentences with ease. Abdominal:     General: Abdomen is flat. Bowel sounds are normal.     Palpations: Abdomen is soft.     Tenderness: There is abdominal tenderness. There is no guarding or rebound.     Comments: Diffuse abdominal tenderness palpation.  No rebounding or guarding.  No overlying skin changes noted.  Musculoskeletal:        General: No deformity.     Cervical back: Normal range of motion.  Skin:    General: Skin is warm and dry.     Capillary Refill: Capillary refill takes less than 2 seconds.  Neurological:     General: No focal deficit present.     Mental Status: She is alert. Mental status is at baseline.     Cranial Nerves: No cranial nerve deficit.     Motor: No weakness.    ED Results / Procedures / Treatments   Labs (all labs ordered are listed, but only abnormal results are displayed) Labs Reviewed  URINALYSIS, ROUTINE W REFLEX MICROSCOPIC - Abnormal; Notable for the following components:      Result Value   APPearance HAZY (*)    Hgb urine dipstick TRACE (*)    Protein, ur TRACE (*)    All  other components within normal limits  ETHANOL - Abnormal; Notable for the following components:   Alcohol, Ethyl (B) 247 (*)    All other components within normal limits  MAGNESIUM - Abnormal; Notable for the following components:   Magnesium 1.2 (*)    All other components within normal limits  COMPREHENSIVE METABOLIC PANEL - Abnormal; Notable for the following components:   Potassium 2.5 (*)    Chloride 96 (*)    Calcium 7.9 (*)    Albumin 2.7 (*)    AST 70 (*)    Alkaline  Phosphatase 149 (*)    Anion gap 18 (*)    All other components within normal limits  CBC - Abnormal; Notable for the following components:   Hemoglobin 10.6 (*)    HCT 33.6 (*)    MCV 72.7 (*)    MCH 22.9 (*)    RDW 23.2 (*)    All other components within normal limits  LACTIC ACID, PLASMA - Abnormal; Notable for the following components:   Lactic Acid, Venous 7.5 (*)    All other components within normal limits  LACTIC ACID, PLASMA - Abnormal; Notable for the following components:   Lactic Acid, Venous 7.1 (*)    All other components within normal limits  BASIC METABOLIC PANEL - Abnormal; Notable for the following components:   Potassium 2.3 (*)    BUN 5 (*)    Calcium 7.3 (*)    Anion gap 17 (*)    All other components within normal limits  BLOOD GAS, VENOUS - Abnormal; Notable for the following components:   pO2, Ven 45.2 (*)    All other components within normal limits  URINALYSIS, MICROSCOPIC (REFLEX) - Abnormal; Notable for the following components:   Bacteria, UA MANY (*)    All other components within normal limits  CBG MONITORING, ED - Abnormal; Notable for the following components:   Glucose-Capillary 56 (*)    All other components within normal limits  CBG MONITORING, ED - Abnormal; Notable for the following components:   Glucose-Capillary 67 (*)    All other components within normal limits  CBG MONITORING, ED - Abnormal; Notable for the following components:   Glucose-Capillary 109  (*)    All other components within normal limits  I-STAT VENOUS BLOOD GAS, ED - Abnormal; Notable for the following components:   pCO2, Ven 40.6 (*)    pO2, Ven 153.0 (*)    Potassium 2.2 (*)    Calcium, Ion 0.94 (*)    HCT 32.0 (*)    Hemoglobin 10.9 (*)    All other components within normal limits  RESP PANEL BY RT-PCR (FLU A&B, COVID) ARPGX2  CULTURE, BLOOD (ROUTINE X 2)  CULTURE, BLOOD (ROUTINE X 2)  BRAIN NATRIURETIC PEPTIDE  LIPASE, BLOOD  HCG, QUANTITATIVE, PREGNANCY  RAPID URINE DRUG SCREEN, HOSP PERFORMED  CBG MONITORING, ED  TROPONIN I (HIGH SENSITIVITY)  TROPONIN I (HIGH SENSITIVITY)    EKG EKG Interpretation  Date/Time:  Wednesday August 06 2021 21:16:14 EST Ventricular Rate:  85 PR Interval:  164 QRS Duration: 96 QT Interval:  486 QTC Calculation: 578 R Axis:   92 Text Interpretation: Sinus rhythm Borderline right axis deviation Borderline T abnormalities, anterior leads Prolonged QT interval Confirmed by Wandra Arthurs [phone]) on 08/06/2021 9:41:59 PM  Radiology CT Angio Chest PE W and/or Wo Contrast  Result Date: 08/06/2021 CLINICAL DATA:  PE suspected, high prob; Abdominal pain, acute, nonlocalized. body aches , fever, nasal congestion , h/a x 1 week, c/o chest wall pain with pro cough EXAM: CT ANGIOGRAPHY CHEST CT ABDOMEN AND PELVIS WITH CONTRAST TECHNIQUE: Multidetector CT imaging of the chest was performed using the standard protocol during bolus administration of intravenous contrast. Multiplanar CT image reconstructions and MIPs were obtained to evaluate the vascular anatomy. Multidetector CT imaging of the abdomen and pelvis was performed using the standard protocol during bolus administration of intravenous contrast. CONTRAST:  141mL OMNIPAQUE IOHEXOL 350 MG/ML SOLN COMPARISON:  None. FINDINGS: CTA CHEST FINDINGS Cardiovascular: Satisfactory opacification of the pulmonary arteries to the segmental level.  No evidence of pulmonary embolism. Normal heart size.  No pericardial effusion. Mediastinum/Nodes: No enlarged mediastinal, hilar, or axillary lymph nodes. Thyroid gland, trachea, and esophagus demonstrate no significant findings. Lungs/Pleura: No focal consolidation. No pulmonary nodule. No pulmonary mass. No pleural effusion. No pneumothorax. Musculoskeletal: No chest wall abnormality. No suspicious lytic or blastic osseous lesions. No acute displaced fracture. Multilevel degenerative changes of the spine. Review of the MIP images confirms the above findings. CT ABDOMEN and PELVIS FINDINGS Hepatobiliary: The liver is enlarged measuring up to 21 cm. Slightly heterogeneous hepatic parenchyma likely due to perfusion variant. No focal liver abnormality. No gallstones, gallbladder wall thickening, or pericholecystic fluid. No biliary dilatation. Pancreas: Poorly defined uncinate process. No focal lesion. Otherwise normal pancreatic contour. No surrounding inflammatory changes. No main pancreatic ductal dilatation. Spleen: Normal in size without focal abnormality. Adrenals/Urinary Tract: No adrenal nodule bilaterally. Bilateral kidneys enhance symmetrically. No hydronephrosis. No hydroureter. Urinary bladder wall thickening circumferentially likely due to under distension no perivesicular fat stranding. Otherwise urinary bladder is unremarkable. On delayed imaging, there is no urothelial wall thickening and there are no filling defects in the opacified portions of the bilateral collecting systems or ureters. Stomach/Bowel: Stomach is within normal limits. No evidence of small bowel wall thickening or dilatation. Diffuse low-density bowel wall thickening of the colon. Slight hyperemia of the mucosa. Similar finding of the appendix. No pneumatosis. Vascular/Lymphatic: No abdominal aorta or iliac aneurysm. Mild atherosclerotic plaque of the aorta and its branches. No abdominal, pelvic, or inguinal lymphadenopathy. Reproductive: Uterus and bilateral adnexa are unremarkable.  Other: Nonspecific vague fat stranding along the bowel mesentery (5:43). No intraperitoneal free fluid. No intraperitoneal free gas. No organized fluid collection. Musculoskeletal: No abdominal wall hernia or abnormality. No suspicious lytic or blastic osseous lesions. No acute displaced fracture. Multilevel degenerative changes of the spine. Review of the MIP images confirms the above findings. IMPRESSION: 1. No pulmonary embolus. 2. No acute intrathoracic abnormality. 3. Pancolitis. 4. Poorly defined uncinate process of the pancreas. Consider correlating with lipase levels. 5. Hepatomegaly. 6. Nonspecific vague fat stranding along the bowel mesentery. Recommend attention on follow-up. 7.  Aortic Atherosclerosis (ICD10-I70.0). Electronically Signed   By: Iven Finn M.D.   On: 08/06/2021 18:09   CT Abdomen Pelvis W Contrast  Result Date: 08/06/2021 CLINICAL DATA:  PE suspected, high prob; Abdominal pain, acute, nonlocalized. body aches , fever, nasal congestion , h/a x 1 week, c/o chest wall pain with pro cough EXAM: CT ANGIOGRAPHY CHEST CT ABDOMEN AND PELVIS WITH CONTRAST TECHNIQUE: Multidetector CT imaging of the chest was performed using the standard protocol during bolus administration of intravenous contrast. Multiplanar CT image reconstructions and MIPs were obtained to evaluate the vascular anatomy. Multidetector CT imaging of the abdomen and pelvis was performed using the standard protocol during bolus administration of intravenous contrast. CONTRAST:  147m OMNIPAQUE IOHEXOL 350 MG/ML SOLN COMPARISON:  None. FINDINGS: CTA CHEST FINDINGS Cardiovascular: Satisfactory opacification of the pulmonary arteries to the segmental level. No evidence of pulmonary embolism. Normal heart size. No pericardial effusion. Mediastinum/Nodes: No enlarged mediastinal, hilar, or axillary lymph nodes. Thyroid gland, trachea, and esophagus demonstrate no significant findings. Lungs/Pleura: No focal consolidation. No  pulmonary nodule. No pulmonary mass. No pleural effusion. No pneumothorax. Musculoskeletal: No chest wall abnormality. No suspicious lytic or blastic osseous lesions. No acute displaced fracture. Multilevel degenerative changes of the spine. Review of the MIP images confirms the above findings. CT ABDOMEN and PELVIS FINDINGS Hepatobiliary: The liver is enlarged measuring up to 21 cm.  Slightly heterogeneous hepatic parenchyma likely due to perfusion variant. No focal liver abnormality. No gallstones, gallbladder wall thickening, or pericholecystic fluid. No biliary dilatation. Pancreas: Poorly defined uncinate process. No focal lesion. Otherwise normal pancreatic contour. No surrounding inflammatory changes. No main pancreatic ductal dilatation. Spleen: Normal in size without focal abnormality. Adrenals/Urinary Tract: No adrenal nodule bilaterally. Bilateral kidneys enhance symmetrically. No hydronephrosis. No hydroureter. Urinary bladder wall thickening circumferentially likely due to under distension no perivesicular fat stranding. Otherwise urinary bladder is unremarkable. On delayed imaging, there is no urothelial wall thickening and there are no filling defects in the opacified portions of the bilateral collecting systems or ureters. Stomach/Bowel: Stomach is within normal limits. No evidence of small bowel wall thickening or dilatation. Diffuse low-density bowel wall thickening of the colon. Slight hyperemia of the mucosa. Similar finding of the appendix. No pneumatosis. Vascular/Lymphatic: No abdominal aorta or iliac aneurysm. Mild atherosclerotic plaque of the aorta and its branches. No abdominal, pelvic, or inguinal lymphadenopathy. Reproductive: Uterus and bilateral adnexa are unremarkable. Other: Nonspecific vague fat stranding along the bowel mesentery (5:43). No intraperitoneal free fluid. No intraperitoneal free gas. No organized fluid collection. Musculoskeletal: No abdominal wall hernia or  abnormality. No suspicious lytic or blastic osseous lesions. No acute displaced fracture. Multilevel degenerative changes of the spine. Review of the MIP images confirms the above findings. IMPRESSION: 1. No pulmonary embolus. 2. No acute intrathoracic abnormality. 3. Pancolitis. 4. Poorly defined uncinate process of the pancreas. Consider correlating with lipase levels. 5. Hepatomegaly. 6. Nonspecific vague fat stranding along the bowel mesentery. Recommend attention on follow-up. 7.  Aortic Atherosclerosis (ICD10-I70.0). Electronically Signed   By: Iven Finn M.D.   On: 08/06/2021 18:09   DG Chest Portable 1 View  Result Date: 08/06/2021 CLINICAL DATA:  Body aches EXAM: PORTABLE CHEST 1 VIEW COMPARISON:  Chest x-ray 04/30/2020 FINDINGS: Heart size and mediastinal contours are within normal limits. No suspicious pulmonary opacities identified. No pleural effusion or pneumothorax visualized. No acute osseous abnormality appreciated. IMPRESSION: No acute intrathoracic process identified. Electronically Signed   By: Ofilia Neas M.D.   On: 08/06/2021 15:24    Procedures Procedures   Medications Ordered in ED Medications  potassium chloride 10 mEq in 100 mL IVPB (10 mEq Intravenous New Bag/Given 08/06/21 1702)  potassium chloride 10 mEq in 100 mL IVPB (10 mEq Intravenous New Bag/Given 08/07/21 0038)  dextrose 10 % infusion ( Intravenous New Bag/Given 08/06/21 2149)  ondansetron (ZOFRAN) 4 MG/2ML injection (4 mg  Given 08/06/21 1552)  sodium chloride 0.9 % bolus 1,000 mL (0 mLs Intravenous Stopped 08/06/21 1654)  levETIRAcetam (KEPPRA) IVPB 1000 mg/100 mL premix (0 mg Intravenous Stopped 08/06/21 1650)  dextrose 50 % solution 50 mL (50 mLs Intravenous Given 08/06/21 1611)  magnesium sulfate IVPB 2 g 50 mL (0 g Intravenous Stopped 08/06/21 1945)  morphine 4 MG/ML injection 4 mg (4 mg Intravenous Given 08/06/21 1706)  iohexol (OMNIPAQUE) 350 MG/ML injection 100 mL (100 mLs Intravenous Contrast Given  08/06/21 1720)  pantoprazole (PROTONIX) injection 40 mg (40 mg Intravenous Given 08/06/21 1941)  ciprofloxacin (CIPRO) IVPB 400 mg (0 mg Intravenous Stopped 08/06/21 2119)  metroNIDAZOLE (FLAGYL) IVPB 500 mg (500 mg Intravenous New Bag/Given 08/06/21 2120)    ED Course  I have reviewed the triage vital signs and the nursing notes.  Pertinent labs & imaging results that were available during my care of the patient were reviewed by me and considered in my medical decision making (see chart for details).  45 year old female  with asthma, seizures, prior MI, CHF, diabetes presents the emergency room for 1 week of flulike symptoms, less than 24 hours of chest pain or shortness of breath, 2 days of nausea, vomiting, abdominal pain.  Differential diagnosis includes but is not limited to, ACS, flu, COVID, viral illness, gastroenteritis, pancreatitis, PE, dissection, diverticulitis, cholecystitis, cholelithiasis, renal stone, cholangitis, SBO, peptic ulcer disease, GI bleed.  Poor IV access. Dr. Darl Householder to place US guided IV in right [facility].  I personally reviewed the patient's labs and imaging.  Respiratory panel negative for flu and COVID.  Troponin 8.  BNP 84.  Hypomagnesia 1.2.  Patient has a history of this after looking at previous chart from [place].  CBC shows no leukocytosis, mild anemia 10.6/33.6.  This is patient's baseline comparison to previous charts from [facility] on 05/27/2021.  Initially, patient presented with a glucose of 58.  She reports she is a type II diabetic, denied this to the nurse although.  She does not know what medications she takes for diabetes.  D50 given.  Glucose rising last checked 109.  Ethanol elevated at 247.  Lipase negative.  CMP shows hypokalemia at 2.5.  Patient has a history of this and takes her potassium medication occasionally.  Patient was ordered 30 mEq IV and 40 mEq PO.  Lactic acid came back 7.5.  Blood cultures obtained.  Patient reports she has not needed to pee since  being here. Was able to obtain urine eventually and showed trace blood and ketones, otherwise normal. EKG shows borderline QT prolongation.  Imaging- CXR shows obn acute cardiopulmonary process. CT abdomen/CTA chest shows no sign of PE.  No acute intrathoracic abnormality. Pancolitis. Poorly defined uncinate process of the pancreas. Hepatomegaly. Nonspecific vague fat stranding along the bowel mesentery.  For these abnormalities, she was given D50 as previously mentioned. Given 2g of IVPB magnesium sulfate for hypomagnesemia. Unclear of when the patient last took her Keppra so 1g of Keppra administered. The patient was bolused with 1L NS. For pain management, the patient was given 20m morphine. The patient was given 443mof Zofran to stop the patient's acute vomiting.   On initial hypokalemia, the patient was given 3025mIV and 36m55mShe refused the PO. Patient has worsening potassium from 2.5 to 2.2. 30mE56m K ordered.   Her glucose downtrended to 74. Dextrose 10% ordered.   Repeat EKG ordered and showed worsening Qtc from 454 to 542. Patient continues to vomit. [facility] and [facility] to administer for nausea with a prolonged Qtc. Advised nurse to have patient sniff alcohol pads.   Lactic Acid and Blood cultures obtained given Pancolitis reading. Lactic Acid elevated at 7.5. Patient does not met Sepsis criteria given stable vital signs and no leukocytosis. My attending placed orders for cipro and flagyl given pancolitis.   [facility] requests repeat EKG and VBG. The patient's VBG reflects the worsening hypokalemia. No acidosis.   Based on patient's hypokalemia, pancolitis, hypoglycemia, and intractable vomiting, will admit for further management. [facility].   I discussed this case with my attending physician who cosigned this note including patient's presenting symptoms, physical exam, and planned  diagnostics and interventions. Attending physician stated agreement with plan or made changes to plan which were implemented.   Attending physician assessed patient at bedside.    MDM Rules/Calculators/A&P  Final Clinical Impression(s) / ED Diagnoses Final diagnoses:  Pancolitis ([place])  Hypokalemia    Rx / DC Orders ED Discharge Orders     None        Sherrell Puller, PA-C 08/07/21 0115    Drenda Freeze, MD 08/09/21 2158

## 2021-08-06 NOTE — ED Notes (Signed)
ED Provider at bedside. 

## 2021-08-06 NOTE — ED Notes (Signed)
Pt reports not feeling well x 3 days.  Chest wall pain d/t coughing.  States her chest is hurting and has had a previous MI.  Very poor historian, reviewed meds with her she takes them on and off.  Has hx of seizures and has not had Keppra in 3-4 days.  States her last seizure was 3 weeks ago.

## 2021-08-06 NOTE — ED Notes (Signed)
2nd blood cx not obtained due to poor venous access and pt has been stuck numerous times today.

## 2021-08-06 NOTE — ED Notes (Signed)
Report given to [facility] RN @ [place]

## 2021-08-06 NOTE — ED Notes (Addendum)
Several attempts made to obtain IV access with [place] without success.  ED provider at bedside with [place]  OJ given

## 2021-08-06 NOTE — ED Notes (Signed)
Pt vomited lg amt of green colored emesis.  C/o abdominal pain.  Urine specimen sent to lab

## 2021-08-06 NOTE — ED Notes (Signed)
Report diven to Driss RN at [facility] , now pt wishes to be trasferred to [facility], ED provider made aware

## 2021-08-06 NOTE — ED Notes (Addendum)
Report given April Giles RN with [facility] @2110 

## 2021-08-07 ENCOUNTER — Encounter (HOSPITAL_COMMUNITY): Payer: Self-pay | Admitting: Internal Medicine

## 2021-08-07 ENCOUNTER — Observation Stay: Payer: Self-pay

## 2021-08-07 DIAGNOSIS — E876 Hypokalemia: Secondary | ICD-10-CM

## 2021-08-07 DIAGNOSIS — R9431 Abnormal electrocardiogram [ECG] [EKG]: Secondary | ICD-10-CM

## 2021-08-07 DIAGNOSIS — E162 Hypoglycemia, unspecified: Secondary | ICD-10-CM | POA: Diagnosis present

## 2021-08-07 DIAGNOSIS — F1010 Alcohol abuse, uncomplicated: Secondary | ICD-10-CM | POA: Diagnosis present

## 2021-08-07 DIAGNOSIS — K5100 Ulcerative (chronic) pancolitis without complications: Secondary | ICD-10-CM

## 2021-08-07 LAB — COMPREHENSIVE METABOLIC PANEL
ALT: 14 U/L (ref 0–44)
AST: 51 U/L — ABNORMAL HIGH (ref 15–41)
Albumin: 2.3 g/dL — ABNORMAL LOW (ref 3.5–5.0)
Alkaline Phosphatase: 127 U/L — ABNORMAL HIGH (ref 38–126)
Anion gap: 13 (ref 5–15)
BUN: 5 mg/dL — ABNORMAL LOW (ref 6–20)
CO2: 24 mmol/L (ref 22–32)
Calcium: 7.2 mg/dL — ABNORMAL LOW (ref 8.9–10.3)
Chloride: 98 mmol/L (ref 98–111)
Creatinine, Ser: 0.55 mg/dL (ref 0.44–1.00)
GFR, Estimated: >60 mL/min (ref >60)
Glucose, Bld: 102 mg/dL — ABNORMAL HIGH (ref 70–99)
Potassium: 2.9 mmol/L — ABNORMAL LOW (ref 3.5–5.1)
Sodium: 135 mmol/L (ref 135–145)
Total Bilirubin: 1.1 mg/dL (ref 0.3–1.2)
Total Protein: 5.9 g/dL — ABNORMAL LOW (ref 6.5–8.1)

## 2021-08-07 LAB — CBC WITH DIFFERENTIAL/PLATELET
Abs Immature Granulocytes: 0.02 10*3/uL (ref 0.00–0.07)
Abs Immature Granulocytes: 0.02 10*3/uL (ref 0.00–0.07)
Basophils Absolute: 0 10*3/uL (ref 0.0–0.1)
Basophils Absolute: 0 10*3/uL (ref 0.0–0.1)
Basophils Relative: 1 %
Basophils Relative: 1 %
Eosinophils Absolute: 0.1 10*3/uL (ref 0.0–0.5)
Eosinophils Absolute: 0.1 10*3/uL (ref 0.0–0.5)
Eosinophils Relative: 1 %
Eosinophils Relative: 2 %
HCT: 30.5 % — ABNORMAL LOW (ref 36.0–46.0)
HCT: 26.4 % — ABNORMAL LOW (ref 36.0–46.0)
Hemoglobin: 9.2 g/dL — ABNORMAL LOW (ref 12.0–15.0)
Hemoglobin: 8.2 g/dL — ABNORMAL LOW (ref 12.0–15.0)
Immature Granulocytes: 0 %
Immature Granulocytes: 1 %
Lymphocytes Relative: 21 %
Lymphocytes Relative: 24 %
Lymphs Abs: 1.1 10*3/uL (ref 0.7–4.0)
Lymphs Abs: 1 10*3/uL (ref 0.7–4.0)
MCH: 22.7 pg — ABNORMAL LOW (ref 26.0–34.0)
MCH: 22.9 pg — ABNORMAL LOW (ref 26.0–34.0)
MCHC: 30.2 g/dL (ref 30.0–36.0)
MCHC: 31.1 g/dL (ref 30.0–36.0)
MCV: 75.3 fL — ABNORMAL LOW (ref 80.0–100.0)
MCV: 73.7 fL — ABNORMAL LOW (ref 80.0–100.0)
Monocytes Absolute: 0.8 10*3/uL (ref 0.1–1.0)
Monocytes Absolute: 0.5 10*3/uL (ref 0.1–1.0)
Monocytes Relative: 15 %
Monocytes Relative: 12 %
Neutro Abs: 3.4 10*3/uL (ref 1.7–7.7)
Neutro Abs: 2.4 10*3/uL (ref 1.7–7.7)
Neutrophils Relative %: 62 %
Neutrophils Relative %: 60 %
Platelets: 195 10*3/uL (ref 150–400)
Platelets: 162 10*3/uL (ref 150–400)
RBC: 4.05 MIL/uL (ref 3.87–5.11)
RBC: 3.58 MIL/uL — ABNORMAL LOW (ref 3.87–5.11)
RDW: 23.1 % — ABNORMAL HIGH (ref 11.5–15.5)
RDW: 22.4 % — ABNORMAL HIGH (ref 11.5–15.5)
WBC: 5.4 10*3/uL (ref 4.0–10.5)
WBC: 4 10*3/uL (ref 4.0–10.5)
nRBC: 0 % (ref 0.0–0.2)
nRBC: 0 % (ref 0.0–0.2)

## 2021-08-07 LAB — RAPID URINE DRUG SCREEN, HOSP PERFORMED
Amphetamines: NOT DETECTED
Amphetamines: NOT DETECTED
Barbiturates: NOT DETECTED
Barbiturates: NOT DETECTED
Benzodiazepines: NOT DETECTED
Benzodiazepines: NOT DETECTED
Cocaine: NOT DETECTED
Cocaine: NOT DETECTED
Opiates: POSITIVE — AB
Opiates: POSITIVE — AB
Tetrahydrocannabinol: NOT DETECTED
Tetrahydrocannabinol: POSITIVE — AB

## 2021-08-07 LAB — GLUCOSE, CAPILLARY
Glucose-Capillary: 107 mg/dL — ABNORMAL HIGH (ref 70–99)
Glucose-Capillary: 113 mg/dL — ABNORMAL HIGH (ref 70–99)
Glucose-Capillary: 59 mg/dL — ABNORMAL LOW (ref 70–99)
Glucose-Capillary: 67 mg/dL — ABNORMAL LOW (ref 70–99)
Glucose-Capillary: 74 mg/dL (ref 70–99)
Glucose-Capillary: 75 mg/dL (ref 70–99)
Glucose-Capillary: 75 mg/dL (ref 70–99)
Glucose-Capillary: 81 mg/dL (ref 70–99)
Glucose-Capillary: 95 mg/dL (ref 70–99)
Glucose-Capillary: 96 mg/dL (ref 70–99)

## 2021-08-07 LAB — MAGNESIUM: Magnesium: 1.5 mg/dL — ABNORMAL LOW (ref 1.7–2.4)

## 2021-08-07 LAB — IRON AND TIBC
Iron: 168 ug/dL (ref 28–170)
Saturation Ratios: 86 % — ABNORMAL HIGH (ref 10.4–31.8)
TIBC: 195 ug/dL — ABNORMAL LOW (ref 250–450)
UIBC: 27 ug/dL

## 2021-08-07 LAB — FOLATE: Folate: 4.3 ng/mL — ABNORMAL LOW (ref >5.9)

## 2021-08-07 LAB — LACTIC ACID, PLASMA
Lactic Acid, Venous: 7.1 mmol/L (ref 0.5–1.9)
Lactic Acid, Venous: 4.5 mmol/L (ref 0.5–1.9)

## 2021-08-07 LAB — VITAMIN B12: Vitamin B-12: 651 pg/mL (ref 180–914)

## 2021-08-07 LAB — FERRITIN: Ferritin: 49 ng/mL (ref 11–307)

## 2021-08-07 LAB — POTASSIUM: Potassium: 3.5 mmol/L (ref 3.5–5.1)

## 2021-08-07 LAB — HIV ANTIBODY (ROUTINE TESTING W REFLEX): HIV Screen 4th Generation wRfx: NONREACTIVE

## 2021-08-07 MED ORDER — SODIUM CHLORIDE 0.9 % IV BOLUS
1000.0000 mL | Freq: Once | INTRAVENOUS | Status: AC
  Administered 2021-08-07: 1000 mL via INTRAVENOUS

## 2021-08-07 MED ORDER — CHLORHEXIDINE GLUCONATE CLOTH 2 % EX PADS
6.0000 | MEDICATED_PAD | Freq: Every day | CUTANEOUS | Status: DC
  Administered 2021-08-08 – 2021-08-15 (×8): 6 via TOPICAL

## 2021-08-07 MED ORDER — SODIUM CHLORIDE 0.9 % IV SOLN
1.0000 g | INTRAVENOUS | Status: DC
  Administered 2021-08-07: 1 g via INTRAVENOUS
  Filled 2021-08-07: qty 10

## 2021-08-07 MED ORDER — LORAZEPAM 1 MG PO TABS
1.0000 mg | ORAL_TABLET | ORAL | Status: DC | PRN
  Administered 2021-08-07 – 2021-08-08 (×2): 1 mg via ORAL
  Administered 2021-08-08: 4 mg via ORAL
  Administered 2021-08-08: 1 mg via ORAL
  Filled 2021-08-07: qty 4
  Filled 2021-08-07 (×3): qty 1

## 2021-08-07 MED ORDER — LORAZEPAM 2 MG/ML IJ SOLN
1.0000 mg | INTRAMUSCULAR | Status: DC | PRN
  Administered 2021-08-08: 3 mg via INTRAVENOUS
  Administered 2021-08-09 – 2021-08-10 (×3): 2 mg via INTRAVENOUS
  Filled 2021-08-07: qty 2
  Filled 2021-08-07 (×3): qty 1
  Filled 2021-08-07: qty 2

## 2021-08-07 MED ORDER — MAGNESIUM SULFATE 2 GM/50ML IV SOLN
2.0000 g | Freq: Once | INTRAVENOUS | Status: AC
  Administered 2021-08-07: 2 g via INTRAVENOUS
  Filled 2021-08-07: qty 50

## 2021-08-07 MED ORDER — LORAZEPAM 2 MG/ML IJ SOLN
1.0000 mg | INTRAMUSCULAR | Status: DC | PRN
  Administered 2021-08-07: 2 mg via INTRAVENOUS
  Filled 2021-08-07: qty 1

## 2021-08-07 MED ORDER — MAGNESIUM SULFATE 4 GM/100ML IV SOLN
4.0000 g | Freq: Once | INTRAVENOUS | Status: AC
  Administered 2021-08-07: 4 g via INTRAVENOUS
  Filled 2021-08-07: qty 100

## 2021-08-07 MED ORDER — SODIUM CHLORIDE 0.9 % IV BOLUS: 500.0000 mL | Freq: Once | INTRAVENOUS | Status: DC

## 2021-08-07 MED ORDER — POTASSIUM CHLORIDE 10 MEQ/100ML IV SOLN: 10.0000 meq | INTRAVENOUS | Status: DC

## 2021-08-07 MED ORDER — THIAMINE HCL 100 MG PO TABS: 100.0000 mg | ORAL_TABLET | Freq: Every day | ORAL | Status: DC

## 2021-08-07 MED ORDER — PANTOPRAZOLE SODIUM 40 MG IV SOLR
40.0000 mg | INTRAVENOUS | Status: DC
  Administered 2021-08-07: 40 mg via INTRAVENOUS
  Filled 2021-08-07: qty 40

## 2021-08-07 MED ORDER — ADULT MULTIVITAMIN W/MINERALS CH
1.0000 | ORAL_TABLET | Freq: Every day | ORAL | Status: DC
  Administered 2021-08-09 – 2021-08-16 (×7): 1 via ORAL
  Filled 2021-08-07 (×8): qty 1

## 2021-08-07 MED ORDER — SODIUM CHLORIDE 0.9% FLUSH
10.0000 mL | INTRAVENOUS | Status: DC | PRN
  Administered 2021-08-08: 10 mL

## 2021-08-07 MED ORDER — LORAZEPAM 2 MG/ML IJ SOLN
1.0000 mg | Freq: Four times a day (QID) | INTRAMUSCULAR | Status: DC | PRN
  Administered 2021-08-07: 1 mg via INTRAVENOUS

## 2021-08-07 MED ORDER — POTASSIUM CHLORIDE 2 MEQ/ML IV SOLN: INTRAVENOUS | Status: DC

## 2021-08-07 MED ORDER — LORAZEPAM 2 MG/ML IJ SOLN
0.0000 mg | Freq: Four times a day (QID) | INTRAMUSCULAR | Status: DC
  Administered 2021-08-08: 2 mg via INTRAVENOUS
  Filled 2021-08-07: qty 1

## 2021-08-07 MED ORDER — KETOROLAC TROMETHAMINE 30 MG/ML IJ SOLN
30.0000 mg | Freq: Four times a day (QID) | INTRAMUSCULAR | Status: AC | PRN
  Administered 2021-08-07 (×2): 30 mg via INTRAVENOUS
  Filled 2021-08-07 (×2): qty 1

## 2021-08-07 MED ORDER — DEXTROSE 50 % IV SOLN
INTRAVENOUS | Status: AC
  Filled 2021-08-07: qty 50

## 2021-08-07 MED ORDER — DEXTROSE 50 % IV SOLN
12.5000 g | INTRAVENOUS | Status: AC
  Administered 2021-08-07: 12.5 g via INTRAVENOUS

## 2021-08-07 MED ORDER — FOLIC ACID 5 MG/ML IJ SOLN
1.0000 mg | Freq: Every day | INTRAMUSCULAR | Status: DC
Start: 2021-08-07 — End: 2021-08-10
  Administered 2021-08-07 – 2021-08-10 (×3): 1 mg via INTRAVENOUS
  Filled 2021-08-07 (×4): qty 0.2

## 2021-08-07 MED ORDER — FOLIC ACID 1 MG PO TABS: 1.0000 mg | ORAL_TABLET | Freq: Every day | ORAL | Status: DC

## 2021-08-07 MED ORDER — KCL IN DEXTROSE-NACL 40-5-0.9 MEQ/L-%-% IV SOLN
INTRAVENOUS | Status: DC
  Filled 2021-08-07 (×10): qty 1000

## 2021-08-07 MED ORDER — SODIUM CHLORIDE 0.9% FLUSH
10.0000 mL | Freq: Two times a day (BID) | INTRAVENOUS | Status: DC
  Administered 2021-08-07 – 2021-08-09 (×3): 10 mL
  Administered 2021-08-11: 40 mL
  Administered 2021-08-11 – 2021-08-14 (×4): 10 mL

## 2021-08-07 MED ORDER — ORAL CARE MOUTH RINSE
15.0000 mL | Freq: Two times a day (BID) | OROMUCOSAL | Status: DC
  Administered 2021-08-08 – 2021-08-15 (×11): 15 mL via OROMUCOSAL

## 2021-08-07 MED ORDER — THIAMINE HCL 100 MG/ML IJ SOLN
100.0000 mg | Freq: Every day | INTRAMUSCULAR | Status: DC
  Administered 2021-08-07 – 2021-08-09 (×3): 100 mg via INTRAVENOUS
  Filled 2021-08-07 (×3): qty 2

## 2021-08-07 MED ORDER — ALBUTEROL SULFATE (2.5 MG/3ML) 0.083% IN NEBU: 2.5000 mg | INHALATION_SOLUTION | Freq: Four times a day (QID) | RESPIRATORY_TRACT | Status: DC | PRN

## 2021-08-07 MED ORDER — LORAZEPAM 2 MG/ML IJ SOLN: 0.0000 mg | Freq: Two times a day (BID) | INTRAMUSCULAR | Status: DC

## 2021-08-07 MED ORDER — LORAZEPAM 2 MG/ML IJ SOLN
0.5000 mg | Freq: Three times a day (TID) | INTRAMUSCULAR | Status: DC | PRN
  Administered 2021-08-08 – 2021-08-14 (×4): 0.5 mg via INTRAVENOUS
  Filled 2021-08-07 (×5): qty 1

## 2021-08-07 MED ORDER — METRONIDAZOLE 500 MG/100ML IV SOLN
500.0000 mg | Freq: Two times a day (BID) | INTRAVENOUS | Status: DC
  Administered 2021-08-07 – 2021-08-16 (×19): 500 mg via INTRAVENOUS
  Filled 2021-08-07 (×19): qty 100

## 2021-08-07 MED ORDER — LORAZEPAM 1 MG PO TABS
1.0000 mg | ORAL_TABLET | ORAL | Status: DC | PRN
  Filled 2021-08-07: qty 1

## 2021-08-07 MED ORDER — FOLIC ACID 5 MG/ML IJ SOLN
1.0000 mg | Freq: Once | INTRAMUSCULAR | Status: DC
Start: 2021-08-07 — End: 2021-08-07

## 2021-08-07 MED ORDER — POTASSIUM CHLORIDE 10 MEQ/100ML IV SOLN
10.0000 meq | INTRAVENOUS | Status: AC
  Administered 2021-08-07 (×4): 10 meq via INTRAVENOUS
  Filled 2021-08-07 (×3): qty 100

## 2021-08-07 MED ORDER — ENOXAPARIN SODIUM 40 MG/0.4ML IJ SOSY: 40.0000 mg | PREFILLED_SYRINGE | INTRAMUSCULAR | Status: DC

## 2021-08-07 MED ORDER — SODIUM CHLORIDE 0.9 % IV SOLN
2.0000 g | INTRAVENOUS | Status: DC
Start: 2021-08-08 — End: 2021-08-08
  Administered 2021-08-08: 2 g via INTRAVENOUS
  Filled 2021-08-07: qty 20

## 2021-08-07 MED ORDER — MORPHINE SULFATE (PF) 2 MG/ML IV SOLN
1.0000 mg | INTRAVENOUS | Status: DC | PRN
  Administered 2021-08-07 – 2021-08-15 (×7): 1 mg via INTRAVENOUS
  Filled 2021-08-07 (×7): qty 1

## 2021-08-07 MED ORDER — CHLORHEXIDINE GLUCONATE 0.12 % MT SOLN
15.0000 mL | Freq: Two times a day (BID) | OROMUCOSAL | Status: DC
  Administered 2021-08-08 – 2021-08-16 (×14): 15 mL via OROMUCOSAL
  Filled 2021-08-07 (×14): qty 15

## 2021-08-07 NOTE — Progress Notes (Signed)
Peripherally Inserted Central Catheter Placement  The IV Nurse has discussed with the patient and/or persons authorized to consent for the patient, the purpose of this procedure and the potential benefits and risks involved with this procedure.  The benefits include less needle sticks, lab draws from the catheter, and the patient may be discharged home with the catheter. Risks include, but not limited to, infection, bleeding, blood clot (thrombus formation), and puncture of an artery; nerve damage and irregular heartbeat and possibility to perform a PICC exchange if needed/ordered by physician.  Alternatives to this procedure were also discussed.  Bard Power PICC patient education guide, fact sheet on infection prevention and patient information card has been provided to patient /or left at bedside.   Consent obtained via telephone with mother   PICC Placement Documentation  PICC Double Lumen 08/07/21 PICC Right Brachial 34 cm 1 cm (Active)  Indication for Insertion or Continuance of Line Poor Vasculature-patient has had multiple peripheral attempts or PIVs lasting less than 24 hours 08/07/21 1300  Exposed Catheter (cm) 1 cm 08/07/21 1300  Site Assessment Clean;Dry;Intact 08/07/21 1300  Lumen #1 Status Flushed;Saline locked;Blood return noted 08/07/21 1300  Lumen #2 Status Flushed;Saline locked;Blood return noted 08/07/21 1300  Dressing Type Transparent;Securing device 08/07/21 1300  Dressing Status Clean;Dry;Intact 08/07/21 1300  Antimicrobial disc in place? Yes 08/07/21 1300  Safety Lock Not Applicable 08/07/21 1300  Line Care Connections checked and tightened 08/07/21 1300  Dressing Intervention New dressing 08/07/21 1300  Dressing Change Due 08/14/21 08/07/21 1300       Franne Grip Renee 08/07/2021, 1:17 PM

## 2021-08-07 NOTE — Progress Notes (Signed)
I have seen and assessed patient and agree with Dr. Tennis Must assessment and plan.  Patient is a 45 year old female history of asthma, seizure disorder, alcohol abuse, peptic ulcer disease, GI bleed, anemia, coronary artery disease, CHF presenting to the ED with a 1 week history of flulike symptoms with body aches, cough, shortness of breath, chest pain, nausea vomiting, generalized abdominal pain.  Patient noted to have a CBG of 56 on presentation to the ED and given dextrose.  CT abdomen and pelvis done concerning for pancolitis.  CT angiogram chest done was negative for PE.  Patient noted to have a elevated lactic acid level, severe hypokalemia.  Patient placed empirically on IV antibiotics, IV potassium supplementation, IV magnesium, kept n.p.o.  Continue supportive care.  Continue Ativan withdrawal protocol.  No charge.

## 2021-08-07 NOTE — Progress Notes (Signed)
Patient less somnolent than previous during the shift.  Able to maintain conversation, answer orientation questions correctly.  Requested diet advance.  MD ordered clear liquid diet.  Administered morphine PRN for 9/10 generalized pain, refractory to toradol.  Pt using personal phone, reading menu.  Bradd Burner, RN

## 2021-08-07 NOTE — Progress Notes (Signed)
Hypoglycemic Event  CBG: 67  Treatment: D50 25 mL (12.5 gm)  Symptoms:  lethargy  Follow-up CBG: [id number] CBG Result:107  Possible Reasons for Event: Vomiting and Inadequate meal intake  Comments/MD notified:MD notified    Bradd Burner

## 2021-08-07 NOTE — Progress Notes (Signed)
Pt ordered chicken broth and mango ice for dinner on clear liquid diet, had emesis occurrence directly after consuming.  Bradd Burner, RN

## 2021-08-07 NOTE — H&P (Signed)
History and Physical    Brandi Romero LAA:[id number] DOB: [birth year] DOA: 08/06/2021  PCP: Pcp, No Patient coming from: [facility]  Chief Complaint: Multiple complaints  HPI: Brandi Romero is a 45 y.o. female with medical history significant of asthma, seizure disorder, marijuana use, alcohol abuse, PUD, GI bleed, anemia, colon polyps, CAD, CHF presented to [facility] ED with 1 week history of flulike symptoms.  Endorsed body aches, cough, shortness of breath, chest pain, nausea, vomiting, and generalized abdominal pain.  In the ED, patient continued to have nausea and vomiting.  Vital signs stable.  CBG 56 on arrival and patient was given dextrose.  WBC 7.0, hemoglobin 10.6 (stable per review of Care Everywhere), platelet count 235k.  Sodium 140, potassium 2.5, chloride 96, bicarb 26, BUN 6, creatinine 0.6, glucose 79.  Corrected calcium 8.9.  AST 70, alk phos 149.  ALT and T bili normal.  Lipase normal.  COVID and influenza PCR negative.  High-sensitivity troponin negative x2.  BNP normal.  Magnesium 1.2.  Blood ethanol level elevated at 247.  Beta-hCG negative.  Lactic acid 7.5 >7.1.  VBG with normal pH.  Blood culture x2 pending.  UA with negative nitrite, negative leukocytes, 0-5 WBCs, 6-10 RBCs, and many bacteria.  UDS positive for opiates and THC.  CT angiogram chest negative for PE; no acute finding.  CT abdomen pelvis showing pancolitis, hepatomegaly, poorly defined uncinate process of the pancreas, and nonspecific vague fat stranding along the bowel mesentery. Patient was given IV dextrose, morphine, Zofran, Protonix, Cipro, Flagyl, Keppra, IV magnesium, IV potassium, and IV fluid bolus.  Patient is a poor historian.  States she has been feeling ill for the past 1 day.  Reports multiple episodes of nausea and vomiting, not able to tolerate p.o. intake.  Also reports multiple episodes of diarrhea and generalized abdominal pain.  Also complaining of chest pain and pain all over her body.   Requesting pain medication.  Denies any recent sick contacts.  States she is coughing a little.  Denies shortness of breath.  No additional history could be obtained as patient started vomiting.  Review of Systems:  All systems reviewed and apart from history of presenting illness, are negative.  Past Medical History:  Diagnosis Date   Asthma    Seizures (HCC)     Past Surgical History:  Procedure Laterality Date   CESAREAN SECTION       reports that she has been smoking cigarettes. She does not have any smokeless tobacco history on file. She reports current alcohol use. She reports current drug use. Drug: Marijuana.  No Known Allergies  History reviewed. No pertinent family history.  Prior to Admission medications   Medication Sig Start Date End Date Taking? Authorizing Provider  diphenhydrAMINE (BENADRYL) 25 MG tablet Take 2 tablets (50 mg total) by mouth every 6 (six) hours. Take 1-2 tablets every 6 hours x 2 days, then space out to an as needed basis 12/22/12   Phillis Haggis, MD  famotidine (PEPCID) 20 MG tablet Take 1 tablet (20 mg total) by mouth 2 (two) times daily. 12/22/12   Mabe, Latanya Maudlin, MD  phenytoin (DILANTIN) 300 MG ER capsule Take 1 capsule (300 mg total) by mouth at bedtime. 05/29/13   Ivonne Andrew, PA-C  predniSONE (DELTASONE) 50 MG tablet Take 1 tablet (50 mg total) by mouth daily. 12/22/12   Phillis Haggis, MD    Physical Exam: Vitals:   08/06/21 1700 08/06/21 2040 08/06/21 2100 08/06/21 2309  BP: 113/74 116/89 112/83 116/82  Pulse: 87  80 81  Resp: $Remo'17 18 13 19  'KGSql$ Temp:   98 F (36.7 C) 98.4 F (36.9 C)  TempSrc:   Oral Oral  SpO2: 99%  96% 98%  Weight:      Height:        Physical Exam Constitutional:      Appearance: She is not diaphoretic.  HENT:     Head: Normocephalic and atraumatic.  Eyes:     Extraocular Movements: Extraocular movements intact.     Conjunctiva/sclera: Conjunctivae normal.  Cardiovascular:     Rate and Rhythm: Normal rate and  regular rhythm.     Pulses: Normal pulses.  Pulmonary:     Effort: Pulmonary effort is normal. No respiratory distress.     Breath sounds: No wheezing or rales.  Abdominal:     General: Bowel sounds are normal. There is no distension.     Palpations: Abdomen is soft.     Tenderness: There is abdominal tenderness. There is guarding.     Comments: Generalized tenderness to palpation with guarding  Musculoskeletal:        General: No swelling or tenderness.     Cervical back: Normal range of motion and neck supple.  Skin:    General: Skin is warm and dry.  Neurological:     General: No focal deficit present.     Mental Status: She is alert and oriented to person, place, and time.     Labs on Admission: I have personally reviewed following labs and imaging studies  CBC: Recent Labs  Lab 08/06/21 1559 08/06/21 2124  WBC 7.0  --   HGB 10.6* 10.9*  HCT 33.6* 32.0*  MCV 72.7*  --   PLT 235  --    Basic Metabolic Panel: Recent Labs  Lab 08/06/21 1550 08/06/21 2031 08/06/21 2124  NA 140 138 141  K 2.5* 2.3* 2.2*  CL 96* 98  --   CO2 26 23  --   GLUCOSE 79 92  --   BUN 6 5*  --   CREATININE 0.62 0.64  --   CALCIUM 7.9* 7.3*  --   MG 1.2*  --   --    GFR: Estimated Creatinine Clearance: 81.6 mL/min (by C-G formula based on SCr of 0.64 mg/dL). Liver Function Tests: Recent Labs  Lab 08/06/21 1550  AST 70*  ALT 16  ALKPHOS 149*  BILITOT 1.1  PROT 7.0  ALBUMIN 2.7*   Recent Labs  Lab 08/06/21 1550  LIPASE 28   No results for input(s): AMMONIA in the last 168 hours. Coagulation Profile: No results for input(s): INR, PROTIME in the last 168 hours. Cardiac Enzymes: No results for input(s): CKTOTAL, CKMB, CKMBINDEX, TROPONINI in the last 168 hours. BNP (last 3 results) No results for input(s): PROBNP in the last 8760 hours. HbA1C: No results for input(s): HGBA1C in the last 72 hours. CBG: Recent Labs  Lab 08/06/21 1505 08/06/21 1549 08/06/21 1701  08/06/21 2040 08/07/21 0338  GLUCAP 56* 67* 109* 74 96   Lipid Profile: No results for input(s): CHOL, HDL, LDLCALC, TRIG, CHOLHDL, LDLDIRECT in the last 72 hours. Thyroid Function Tests: No results for input(s): TSH, T4TOTAL, FREET4, T3FREE, THYROIDAB in the last 72 hours. Anemia Panel: No results for input(s): VITAMINB12, FOLATE, FERRITIN, TIBC, IRON, RETICCTPCT in the last 72 hours. Urine analysis:    Component Value Date/Time   COLORURINE YELLOW 08/06/2021 2030   APPEARANCEUR HAZY (A) 08/06/2021 2030  LABSPEC 1.010 08/06/2021 2030   PHURINE 6.0 08/06/2021 2030   GLUCOSEU NEGATIVE 08/06/2021 2030   HGBUR TRACE (A) 08/06/2021 2030   BILIRUBINUR NEGATIVE 08/06/2021 2030   KETONESUR NEGATIVE 08/06/2021 2030   PROTEINUR TRACE (A) 08/06/2021 2030   UROBILINOGEN 0.2 05/29/2013 0004   NITRITE NEGATIVE 08/06/2021 2030   LEUKOCYTESUR NEGATIVE 08/06/2021 2030    Radiological Exams on Admission: CT Angio Chest PE W and/or Wo Contrast  Result Date: 08/06/2021 CLINICAL DATA:  PE suspected, high prob; Abdominal pain, acute, nonlocalized. body aches , fever, nasal congestion , h/a x 1 week, c/o chest wall pain with pro cough EXAM: CT ANGIOGRAPHY CHEST CT ABDOMEN AND PELVIS WITH CONTRAST TECHNIQUE: Multidetector CT imaging of the chest was performed using the standard protocol during bolus administration of intravenous contrast. Multiplanar CT image reconstructions and MIPs were obtained to evaluate the vascular anatomy. Multidetector CT imaging of the abdomen and pelvis was performed using the standard protocol during bolus administration of intravenous contrast. CONTRAST:  157mL OMNIPAQUE IOHEXOL 350 MG/ML SOLN COMPARISON:  None. FINDINGS: CTA CHEST FINDINGS Cardiovascular: Satisfactory opacification of the pulmonary arteries to the segmental level. No evidence of pulmonary embolism. Normal heart size. No pericardial effusion. Mediastinum/Nodes: No enlarged mediastinal, hilar, or axillary lymph  nodes. Thyroid gland, trachea, and esophagus demonstrate no significant findings. Lungs/Pleura: No focal consolidation. No pulmonary nodule. No pulmonary mass. No pleural effusion. No pneumothorax. Musculoskeletal: No chest wall abnormality. No suspicious lytic or blastic osseous lesions. No acute displaced fracture. Multilevel degenerative changes of the spine. Review of the MIP images confirms the above findings. CT ABDOMEN and PELVIS FINDINGS Hepatobiliary: The liver is enlarged measuring up to 21 cm. Slightly heterogeneous hepatic parenchyma likely due to perfusion variant. No focal liver abnormality. No gallstones, gallbladder wall thickening, or pericholecystic fluid. No biliary dilatation. Pancreas: Poorly defined uncinate process. No focal lesion. Otherwise normal pancreatic contour. No surrounding inflammatory changes. No main pancreatic ductal dilatation. Spleen: Normal in size without focal abnormality. Adrenals/Urinary Tract: No adrenal nodule bilaterally. Bilateral kidneys enhance symmetrically. No hydronephrosis. No hydroureter. Urinary bladder wall thickening circumferentially likely due to under distension no perivesicular fat stranding. Otherwise urinary bladder is unremarkable. On delayed imaging, there is no urothelial wall thickening and there are no filling defects in the opacified portions of the bilateral collecting systems or ureters. Stomach/Bowel: Stomach is within normal limits. No evidence of small bowel wall thickening or dilatation. Diffuse low-density bowel wall thickening of the colon. Slight hyperemia of the mucosa. Similar finding of the appendix. No pneumatosis. Vascular/Lymphatic: No abdominal aorta or iliac aneurysm. Mild atherosclerotic plaque of the aorta and its branches. No abdominal, pelvic, or inguinal lymphadenopathy. Reproductive: Uterus and bilateral adnexa are unremarkable. Other: Nonspecific vague fat stranding along the bowel mesentery (5:43). No intraperitoneal free  fluid. No intraperitoneal free gas. No organized fluid collection. Musculoskeletal: No abdominal wall hernia or abnormality. No suspicious lytic or blastic osseous lesions. No acute displaced fracture. Multilevel degenerative changes of the spine. Review of the MIP images confirms the above findings. IMPRESSION: 1. No pulmonary embolus. 2. No acute intrathoracic abnormality. 3. Pancolitis. 4. Poorly defined uncinate process of the pancreas. Consider correlating with lipase levels. 5. Hepatomegaly. 6. Nonspecific vague fat stranding along the bowel mesentery. Recommend attention on follow-up. 7.  Aortic Atherosclerosis (ICD10-I70.0). Electronically Signed   By: Iven Finn M.D.   On: 08/06/2021 18:09   CT Abdomen Pelvis W Contrast  Result Date: 08/06/2021 CLINICAL DATA:  PE suspected, high prob; Abdominal pain, acute, nonlocalized. body  aches , fever, nasal congestion , h/a x 1 week, c/o chest wall pain with pro cough EXAM: CT ANGIOGRAPHY CHEST CT ABDOMEN AND PELVIS WITH CONTRAST TECHNIQUE: Multidetector CT imaging of the chest was performed using the standard protocol during bolus administration of intravenous contrast. Multiplanar CT image reconstructions and MIPs were obtained to evaluate the vascular anatomy. Multidetector CT imaging of the abdomen and pelvis was performed using the standard protocol during bolus administration of intravenous contrast. CONTRAST:  OMNIPAQUE IOHEXOL 350 MG/ML SOLN COMPARISON:  None. FINDINGS: CTA CHEST FINDINGS Cardiovascular: Satisfactory opacification of the pulmonary arteries to the segmental level. No evidence of pulmonary embolism. Normal heart size. No pericardial effusion. Mediastinum/Nodes: No enlarged mediastinal, hilar, or axillary lymph nodes. Thyroid gland, trachea, and esophagus demonstrate no significant findings. Lungs/Pleura: No focal consolidation. No pulmonary nodule. No pulmonary mass. No pleural effusion. No pneumothorax. Musculoskeletal: No chest  wall abnormality. No suspicious lytic or blastic osseous lesions. No acute displaced fracture. Multilevel degenerative changes of the spine. Review of the MIP images confirms the above findings. CT ABDOMEN and PELVIS FINDINGS Hepatobiliary: The liver is enlarged measuring up to 21 cm. Slightly heterogeneous hepatic parenchyma likely due to perfusion variant. No focal liver abnormality. No gallstones, gallbladder wall thickening, or pericholecystic fluid. No biliary dilatation. Pancreas: Poorly defined uncinate process. No focal lesion. Otherwise normal pancreatic contour. No surrounding inflammatory changes. No main pancreatic ductal dilatation. Spleen: Normal in size without focal abnormality. Adrenals/Urinary Tract: No adrenal nodule bilaterally. Bilateral kidneys enhance symmetrically. No hydronephrosis. No hydroureter. Urinary bladder wall thickening circumferentially likely due to under distension no perivesicular fat stranding. Otherwise urinary bladder is unremarkable. On delayed imaging, there is no urothelial wall thickening and there are no filling defects in the opacified portions of the bilateral collecting systems or ureters. Stomach/Bowel: Stomach is within normal limits. No evidence of small bowel wall thickening or dilatation. Diffuse low-density bowel wall thickening of the colon. Slight hyperemia of the mucosa. Similar finding of the appendix. No pneumatosis. Vascular/Lymphatic: No abdominal aorta or iliac aneurysm. Mild atherosclerotic plaque of the aorta and its branches. No abdominal, pelvic, or inguinal lymphadenopathy. Reproductive: Uterus and bilateral adnexa are unremarkable. Other: Nonspecific vague fat stranding along the bowel mesentery (5:43). No intraperitoneal free fluid. No intraperitoneal free gas. No organized fluid collection. Musculoskeletal: No abdominal wall hernia or abnormality. No suspicious lytic or blastic osseous lesions. No acute displaced fracture. Multilevel  degenerative changes of the spine. Review of the MIP images confirms the above findings. IMPRESSION: 1. No pulmonary embolus. 2. No acute intrathoracic abnormality. 3. Pancolitis. 4. Poorly defined uncinate process of the pancreas. Consider correlating with lipase levels. 5. Hepatomegaly. 6. Nonspecific vague fat stranding along the bowel mesentery. Recommend attention on follow-up. 7.  Aortic Atherosclerosis (ICD10-I70.0). Electronically Signed   By: Tish Frederickson M.D.   On: 08/06/2021 18:09   DG Chest Portable 1 View  Result Date: 08/06/2021 CLINICAL DATA:  Body aches EXAM: PORTABLE CHEST 1 VIEW COMPARISON:  Chest x-ray 04/30/2020 FINDINGS: Heart size and mediastinal contours are within normal limits. No suspicious pulmonary opacities identified. No pleural effusion or pneumothorax visualized. No acute osseous abnormality appreciated. IMPRESSION: No acute intrathoracic process identified. Electronically Signed   By: Jannifer Hick M.D.   On: 08/06/2021 15:24    EKG: Independently reviewed.  Sinus rhythm, QTC 578.  No prior EKG for comparison.  Assessment/Plan Principal Problem:   Pancolitis (HCC) Active Problems:   Hypokalemia   Hypomagnesemia   QT prolongation   Hypoglycemia  Pancolitis Patient here for evaluation of generalized abdominal pain, diarrhea, and intractable nausea and vomiting.  Significant lactic acidosis likely due to dehydration.  No leukocytosis or other signs of sepsis.  VBG with normal pH.  UDS positive for THC which is also likely contributing to nausea/vomiting.  CT showing pancolitis. -Ativan prn nausea/vomiting.  Avoid QT prolonging antiemetics.  Discontinue ciprofloxacin, switch to ceftriaxone.  Continue Flagyl.  Continue IV fluid hydration, trend lactate.  IV morphine prn pain.  C. difficile PCR, GI pathogen panel, enteric precautions.  Severe hypokalemia Severe hypomagnesemia QT prolongation In the setting of intractable nausea/vomiting, diarrhea. -Replace  potassium and magnesium, continue to monitor labs very closely.  Avoid QT prolonging drugs.  Repeat EKG in a.m.  Hypoglycemia Likely due to poor oral intake.  Unclear whether she has diabetes, no insulin or oral hypoglycemic agents listed in home medications but pharmacy med rec pending.  Patient started on D10 infusion in the ED. -Continue D10 infusion, CBG checks every 4 hours  Chest pain CAD Appears atypical.  ACS less likely as high-sensitivity troponin negative x2.  Slightly elevated liver enzymes CT showing hepatomegaly but no gallstones, gallbladder wall thickening, or biliary dilatation. -Continue to monitor LFTs  Asthma Stable.  No wheezing. -Albuterol as needed  Seizure disorder Phenytoin listed in home medications but patient told ED provider she takes [place].  She was given IV Keppra in the ED.  Pharmacy med rec currently pending.  Alcohol abuse Blood ethanol level elevated on arrival to the ED but at risk of withdrawal during this hospitalization. -CIWA protocol; Ativan as needed.  Thiamine, folate, and multivitamin.  PUD -Continue IV Protonix  CHF BNP normal. -Continue IV fluid hydration given intractable nausea and vomiting.  Monitor volume status closely.  DVT prophylaxis: SCDs Code Status: Full code Family Communication: No family available at this time. Disposition Plan: Status is: Observation  The patient remains OBS appropriate and will d/c before 2 midnights.  Level of care: Level of care: Progressive  The medical decision making on this patient was of high complexity and the patient is at high risk for clinical deterioration, therefore this is a level 3 visit.  Shela Leff MD [facility]  If 7PM-7AM, please contact night-coverage [email or web address]  08/07/2021, 3:42 AM

## 2021-08-07 NOTE — Progress Notes (Signed)
Hypoglycemic Event  CBG: 59  Treatment: D50 25 mL (12.5 gm)  Symptoms: None  Follow-up CBG: Time:1153 CBG Result:113  Possible Reasons for Event: Other:    Comments/MD notified    Brandi Romero

## 2021-08-08 ENCOUNTER — Other Ambulatory Visit (HOSPITAL_COMMUNITY): Payer: Self-pay

## 2021-08-08 DIAGNOSIS — F10939 Alcohol use, unspecified with withdrawal, unspecified: Secondary | ICD-10-CM | POA: Diagnosis present

## 2021-08-08 DIAGNOSIS — F10930 Alcohol use, unspecified with withdrawal, uncomplicated: Secondary | ICD-10-CM

## 2021-08-08 DIAGNOSIS — R7401 Elevation of levels of liver transaminase levels: Secondary | ICD-10-CM | POA: Diagnosis present

## 2021-08-08 LAB — COMPREHENSIVE METABOLIC PANEL
ALT: 21 U/L (ref 0–44)
AST: 232 U/L — ABNORMAL HIGH (ref 15–41)
Albumin: 2.1 g/dL — ABNORMAL LOW (ref 3.5–5.0)
Alkaline Phosphatase: 122 U/L (ref 38–126)
Anion gap: 9 (ref 5–15)
BUN: <5 mg/dL — ABNORMAL LOW (ref 6–20)
CO2: 24 mmol/L (ref 22–32)
Calcium: 7 mg/dL — ABNORMAL LOW (ref 8.9–10.3)
Chloride: 106 mmol/L (ref 98–111)
Creatinine, Ser: 0.56 mg/dL (ref 0.44–1.00)
GFR, Estimated: >60 mL/min (ref >60)
Glucose, Bld: 108 mg/dL — ABNORMAL HIGH (ref 70–99)
Potassium: 3.8 mmol/L (ref 3.5–5.1)
Sodium: 139 mmol/L (ref 135–145)
Total Bilirubin: 1.5 mg/dL — ABNORMAL HIGH (ref 0.3–1.2)
Total Protein: 5.5 g/dL — ABNORMAL LOW (ref 6.5–8.1)

## 2021-08-08 LAB — GASTROINTESTINAL PANEL BY PCR, STOOL (REPLACES STOOL CULTURE)

## 2021-08-08 LAB — CBC WITH DIFFERENTIAL/PLATELET
Abs Immature Granulocytes: 0.04 10*3/uL (ref 0.00–0.07)
Basophils Absolute: 0 10*3/uL (ref 0.0–0.1)
Basophils Relative: 0 %
Eosinophils Absolute: 0.1 10*3/uL (ref 0.0–0.5)
Eosinophils Relative: 2 %
HCT: 27.9 % — ABNORMAL LOW (ref 36.0–46.0)
Hemoglobin: 8.7 g/dL — ABNORMAL LOW (ref 12.0–15.0)
Immature Granulocytes: 1 %
Lymphocytes Relative: 6 %
Lymphs Abs: 0.4 10*3/uL — ABNORMAL LOW (ref 0.7–4.0)
MCH: 23.1 pg — ABNORMAL LOW (ref 26.0–34.0)
MCHC: 31.2 g/dL (ref 30.0–36.0)
MCV: 74 fL — ABNORMAL LOW (ref 80.0–100.0)
Monocytes Absolute: 0.6 10*3/uL (ref 0.1–1.0)
Monocytes Relative: 11 %
Neutro Abs: 4.8 10*3/uL (ref 1.7–7.7)
Neutrophils Relative %: 80 %
Platelets: 148 10*3/uL — ABNORMAL LOW (ref 150–400)
RBC: 3.77 MIL/uL — ABNORMAL LOW (ref 3.87–5.11)
RDW: 22.4 % — ABNORMAL HIGH (ref 11.5–15.5)
WBC: 5.9 10*3/uL (ref 4.0–10.5)
nRBC: 0 % (ref 0.0–0.2)

## 2021-08-08 LAB — BLOOD CULTURE ID PANEL (REFLEXED) - BCID2

## 2021-08-08 LAB — HEPATITIS PANEL, ACUTE
HCV Ab: NONREACTIVE
Hep A IgM: NONREACTIVE
Hep B C IgM: NONREACTIVE
Hepatitis B Surface Ag: NONREACTIVE

## 2021-08-08 LAB — GLUCOSE, CAPILLARY
Glucose-Capillary: 103 mg/dL — ABNORMAL HIGH (ref 70–99)
Glucose-Capillary: 75 mg/dL (ref 70–99)
Glucose-Capillary: 81 mg/dL (ref 70–99)
Glucose-Capillary: 82 mg/dL (ref 70–99)
Glucose-Capillary: 83 mg/dL (ref 70–99)

## 2021-08-08 LAB — C DIFFICILE QUICK SCREEN W PCR REFLEX
C Diff antigen: POSITIVE — AB
C Diff toxin: NEGATIVE

## 2021-08-08 LAB — PROTIME-INR
INR: 1.4 — ABNORMAL HIGH (ref 0.8–1.2)
Prothrombin Time: 17.3 seconds — ABNORMAL HIGH (ref 11.4–15.2)

## 2021-08-08 LAB — LIPASE, BLOOD: Lipase: 27 U/L (ref 11–51)

## 2021-08-08 LAB — CLOSTRIDIUM DIFFICILE BY PCR, REFLEXED: Toxigenic C. Difficile by PCR: NEGATIVE

## 2021-08-08 LAB — AMMONIA: Ammonia: 33 umol/L (ref 9–35)

## 2021-08-08 LAB — PHOSPHORUS: Phosphorus: 1.6 mg/dL — ABNORMAL LOW (ref 2.5–4.6)

## 2021-08-08 LAB — LACTIC ACID, PLASMA: Lactic Acid, Venous: 5.1 mmol/L (ref 0.5–1.9)

## 2021-08-08 LAB — MAGNESIUM: Magnesium: 1.7 mg/dL (ref 1.7–2.4)

## 2021-08-08 MED ORDER — CHLORDIAZEPOXIDE HCL 25 MG PO CAPS
25.0000 mg | ORAL_CAPSULE | Freq: Three times a day (TID) | ORAL | Status: AC
  Administered 2021-08-09 – 2021-08-10 (×3): 25 mg via ORAL
  Filled 2021-08-08 (×3): qty 1

## 2021-08-08 MED ORDER — MAGNESIUM SULFATE 4 GM/100ML IV SOLN
4.0000 g | Freq: Once | INTRAVENOUS | Status: AC
  Administered 2021-08-08: 4 g via INTRAVENOUS
  Filled 2021-08-08: qty 100

## 2021-08-08 MED ORDER — CHLORDIAZEPOXIDE HCL 25 MG PO CAPS: 25.0000 mg | ORAL_CAPSULE | Freq: Four times a day (QID) | ORAL | Status: AC | PRN

## 2021-08-08 MED ORDER — VANCOMYCIN HCL 125 MG PO CAPS
125.0000 mg | ORAL_CAPSULE | Freq: Four times a day (QID) | ORAL | Status: DC
  Administered 2021-08-08 – 2021-08-16 (×32): 125 mg via ORAL
  Filled 2021-08-08 (×37): qty 1

## 2021-08-08 MED ORDER — POTASSIUM PHOSPHATES 15 MMOLE/5ML IV SOLN
30.0000 mmol | Freq: Once | INTRAVENOUS | Status: AC
  Administered 2021-08-08: 30 mmol via INTRAVENOUS
  Filled 2021-08-08: qty 10

## 2021-08-08 MED ORDER — CHLORDIAZEPOXIDE HCL 25 MG PO CAPS
25.0000 mg | ORAL_CAPSULE | Freq: Every day | ORAL | Status: AC
  Administered 2021-08-11: 25 mg via ORAL
  Filled 2021-08-08: qty 1

## 2021-08-08 MED ORDER — LIP MEDEX EX OINT: TOPICAL_OINTMENT | CUTANEOUS | Status: DC | PRN

## 2021-08-08 MED ORDER — CHLORDIAZEPOXIDE HCL 25 MG PO CAPS
25.0000 mg | ORAL_CAPSULE | Freq: Four times a day (QID) | ORAL | Status: AC
  Administered 2021-08-08 – 2021-08-09 (×4): 25 mg via ORAL
  Filled 2021-08-08 (×4): qty 1

## 2021-08-08 MED ORDER — HYDROXYZINE HCL 25 MG PO TABS
25.0000 mg | ORAL_TABLET | Freq: Four times a day (QID) | ORAL | Status: AC | PRN
  Administered 2021-08-10: 25 mg via ORAL
  Filled 2021-08-08: qty 1

## 2021-08-08 MED ORDER — SUCRALFATE 1 G PO TABS
1.0000 g | ORAL_TABLET | Freq: Three times a day (TID) | ORAL | Status: DC
  Administered 2021-08-08 – 2021-08-16 (×26): 1 g via ORAL
  Filled 2021-08-08 (×27): qty 1

## 2021-08-08 MED ORDER — LORAZEPAM 2 MG/ML IJ SOLN
0.5000 mg | Freq: Once | INTRAMUSCULAR | Status: AC
  Administered 2021-08-08: 0.5 mg via INTRAVENOUS
  Filled 2021-08-08: qty 1

## 2021-08-08 MED ORDER — LOPERAMIDE HCL 2 MG PO CAPS: 2.0000 mg | ORAL_CAPSULE | ORAL | Status: AC | PRN

## 2021-08-08 MED ORDER — LEVETIRACETAM IN NACL 500 MG/100ML IV SOLN
500.0000 mg | Freq: Two times a day (BID) | INTRAVENOUS | Status: DC
Start: 2021-08-08 — End: 2021-08-14
  Administered 2021-08-08 – 2021-08-14 (×12): 500 mg via INTRAVENOUS
  Filled 2021-08-08 (×13): qty 100

## 2021-08-08 MED ORDER — CHLORDIAZEPOXIDE HCL 25 MG PO CAPS
25.0000 mg | ORAL_CAPSULE | ORAL | Status: AC
  Administered 2021-08-10 – 2021-08-11 (×2): 25 mg via ORAL
  Filled 2021-08-08 (×2): qty 1

## 2021-08-08 NOTE — Progress Notes (Signed)
PHARMACY - PHYSICIAN COMMUNICATION CRITICAL VALUE ALERT - BLOOD CULTURE IDENTIFICATION (BCID)  Brandi Romero is an 45 y.o. female who presented to [facility] on 08/06/2021 with a chief complaint of generalized weakness, nausea & vomiting.   Assessment:  CT + pancolitis on IV Rocephin + Flagyl.  Afebrile, WBC WNL. Blood cx now growing GPC in aerobic & anaerobic bottles of 1 set; BCID +methicillin-sensitive S. Epi.    Name of physician (or Provider) Contacted: Janee Morn  Current antibiotics: Rocephin + Flagyl  Changes to prescribed antibiotics recommended:  Patient is on recommended antibiotics - No changes needed Continue to monitor final cx data  Results for orders placed or performed during the hospital encounter of 08/06/21  Blood Culture ID Panel (Reflexed) (Collected: 08/06/2021  7:35 PM)  Result Value Ref Range   Enterococcus faecalis NOT DETECTED NOT DETECTED   Enterococcus Faecium NOT DETECTED NOT DETECTED   Listeria monocytogenes NOT DETECTED NOT DETECTED   Staphylococcus species DETECTED (A) NOT DETECTED   Staphylococcus aureus (BCID) NOT DETECTED NOT DETECTED   Staphylococcus epidermidis DETECTED (A) NOT DETECTED   Staphylococcus lugdunensis NOT DETECTED NOT DETECTED   Streptococcus species NOT DETECTED NOT DETECTED   Streptococcus agalactiae NOT DETECTED NOT DETECTED   Streptococcus pneumoniae NOT DETECTED NOT DETECTED   Streptococcus pyogenes NOT DETECTED NOT DETECTED   A.calcoaceticus-baumannii NOT DETECTED NOT DETECTED   Bacteroides fragilis NOT DETECTED NOT DETECTED   Enterobacterales NOT DETECTED NOT DETECTED   Enterobacter cloacae complex NOT DETECTED NOT DETECTED   Escherichia coli NOT DETECTED NOT DETECTED   Klebsiella aerogenes NOT DETECTED NOT DETECTED   Klebsiella oxytoca NOT DETECTED NOT DETECTED   Klebsiella pneumoniae NOT DETECTED NOT DETECTED   Proteus species NOT DETECTED NOT DETECTED   Salmonella species NOT DETECTED NOT DETECTED   Serratia  marcescens NOT DETECTED NOT DETECTED   Haemophilus influenzae NOT DETECTED NOT DETECTED   Neisseria meningitidis NOT DETECTED NOT DETECTED   Pseudomonas aeruginosa NOT DETECTED NOT DETECTED   Stenotrophomonas maltophilia NOT DETECTED NOT DETECTED   Candida albicans NOT DETECTED NOT DETECTED   Candida auris NOT DETECTED NOT DETECTED   Candida glabrata NOT DETECTED NOT DETECTED   Candida krusei NOT DETECTED NOT DETECTED   Candida parapsilosis NOT DETECTED NOT DETECTED   Candida tropicalis NOT DETECTED NOT DETECTED   Cryptococcus neoformans/gattii NOT DETECTED NOT DETECTED   Methicillin resistance mecA/C NOT DETECTED NOT DETECTED    Junita Push PharmD 08/08/2021  12:49 AM

## 2021-08-08 NOTE — Progress Notes (Signed)
PROGRESS NOTE    Brandi Romero  [id number] DOB: 1976/08/05 DOA: 08/06/2021 PCP: Pcp, No   Chief Complaint  Patient presents with   Generalized Body Aches    Brief Narrative:  Patient 45 year old female history of asthma, seizure disorder, alcohol use, marijuana use, PUD, GI bleed, anemia, colonic polyps presenting to the ED with one 1 week history of flulike symptoms with body aches, cough, shortness of breath, chest pain, nausea vomiting generalized abdominal pain.  In the ED patient noted to have a CBG of 56.  CT angiogram chest done negative for PE.  CT abdomen and pelvis concerning for pancolitis, hepatomegaly, poorly defined uncinate process of the pancreas, nonspecific vague fat stranding along with bowel mesentery.  Patient admitted, placed empirically on IV antibiotics, PPI, supportive care.  Patient noted to be agitated confused likely in alcohol withdrawal and placed on Ativan withdrawal protocol.  Librium detox protocol added to regiment.  Patient also noted to have QTC prolongation electrolyte abnormalities.  Due to worsening transaminitis and pancolitis GI consulted for further evaluation and management.   Assessment & Plan:   Principal Problem:   Pancolitis ([place]) Active Problems:   Alcohol withdrawal (HCC)   Hypokalemia   Hypomagnesemia   QT prolongation   Hypoglycemia   Alcohol abuse   Transaminitis  #1 pancolitis -Patient presented with generalized abdominal pain, diarrhea, intractable nausea and vomiting noted to have a significant lactic acidosis also noted to be significantly dehydrated. -No leukocytosis noted. -UDS done positive for THC which was found may be contributing to nausea and vomiting. -Patient also noted to have an elevated alcohol level on admission. -CT abdomen and pelvis done concerning for pancolitis. -C. difficile PCR done with a positive C. difficile antigen, negative toxin, PCR negative. -Patient however presented with a clinical colitis  with CT evidence of pancolitis and as such patient started on oral vancomycin. -Continue IV Flagyl. -Patient seen in consultation by GI who are recommending discontinuation of IV Rocephin, order already placed. -IV fluids, supportive care.  2.  Transaminitis -Likely secondary to alcohol use. -Patient with history of alcohol use and currently in alcohol withdrawal. -CT abdomen and pelvis done with hepatomegaly, poorly defined uncinate process of the pancreas, no gallstones, no gallbladder wall thickening, no pericholecystic fluid, no biliary dilatation. -Acute hepatitis panel ordered this morning negative. -HIV negative. -INR of 1.4. -GI consulted and few transaminitis likely secondary to alcoholic hepatitis. -Supportive care.  3.  QTC prolongation -Replete electrolytes, keep magnesium around 2, potassium around 4. -Repeat EKG in the a.m.  4.  Hypokalemia/hypomagnesemia/hypophosphatemia -Replete.  5.  Alcohol withdrawal syndrome -Patient noted to be agitated, some confusion, likely in alcohol withdrawal. -Alcohol level on admission was 247. -Patient was on the Ativan withdrawal protocol. -Placed on the Librium detox protocol. -IV fluids, multivitamin, thiamine, folic acid. -TOC consulted.  6.  Microcytic anemia -Likely dilutional and also secondary to folate deficiency and iron deficiency in menstruating female. -Patient with no overt bleeding. -Anemia panel consistent with folate deficiency. -Continue folic acid daily. -Transfusion threshold hemoglobin < 7.  -Follow H&H.  7.  Hypoglycemia -Likely secondary to poor oral intake. -Patient not on insulin or oral hypoglycemic agents prior to admission. -Continue D5 normal saline. -Follow.  8.  Seizure disorder -Patient noted to be on Keppra prior to admission. -Patient received a dose of IV Keppra in the ED. -Resume Keppra at 500 mg IV every 12 hours until able to tolerate oral intake and could transition back to home dose  oral Keppra.  9.  Asthma -Stable.  10.  Peptic ulcer disease -PPI.  -   DVT prophylaxis: SCDs Code Status: Full Family Communication: No family at bedside Disposition:   Status is: Inpatient  Remains inpatient appropriate because: Severity of illness       Consultants:  GI: Dr. Marca Ancona 08/08/2021  Procedures:  CT angiogram chest 08/06/2021 CT abdomen and pelvis 08/06/2021 Chest x-ray 08/06/2021   Antimicrobials: Oral vancomycin 08/08/2021>>>>> IV Rocephin 08/07/2021>>>>> 08/08/2021 IV Flagyl 08/07/2021>>>>>   Subjective: Patient confused, in mittens, Posey belt on, drowsy as patient had just received some Ativan.  Telemetry sitter at bedside.  No bouts of emesis today per RN.  Objective: Vitals:   08/08/21 0347 08/08/21 0758 08/08/21 1240 08/08/21 1734  BP: (!) 127/97 (!) 128/95 (!) 149/96 (!) 158/111  Pulse: 97 83 87 98  Resp:  16    Temp: 97.6 F (36.4 C) 98.5 F (36.9 C)    TempSrc: Axillary Oral    SpO2: 96% 100%    Weight:      Height:        Intake/Output Summary (Last 24 hours) at 08/08/2021 1753 Last data filed at 08/08/2021 1711 Gross per 24 hour  Intake 4959.2 ml  Output 1740 ml  Net 3219.2 ml   Filed Weights   08/06/21 1415  Weight: 59 kg    Examination:  General exam: Drowsy. Respiratory system: Clear to auscultation anterior lung fields.  No wheezes, no crackles, no rhonchi.  Normal respiratory effort.   Cardiovascular system: S1 & S2 heard, RRR. No JVD, murmurs, rubs, gallops or clicks. No pedal edema. Gastrointestinal system: Abdomen is nondistended, soft and some diffuse tenderness to palpation.  No rebound.  No guarding.  Positive bowel sounds.   Central nervous system: Alert and oriented. No focal neurological deficits. Extremities: Symmetric 5 x 5 power. Skin: No rashes, lesions or ulcers Psychiatry: Judgement and insight appear poor.. Mood & affect unable to assess.     Data Reviewed: I have personally reviewed following  labs and imaging studies  CBC: Recent Labs  Lab 08/06/21 1559 08/06/21 2124 08/07/21 0336 08/07/21 2139 08/08/21 0415  WBC 7.0  --  5.4 4.0 5.9  NEUTROABS  --   --  3.4 2.4 4.8  HGB 10.6* 10.9* 9.2* 8.2* 8.7*  HCT 33.6* 32.0* 30.5* 26.4* 27.9*  MCV 72.7*  --  75.3* 73.7* 74.0*  PLT 235  --  195 162 148*    Basic Metabolic Panel: Recent Labs  Lab 08/06/21 1550 08/06/21 2031 08/06/21 2124 08/07/21 0334 08/07/21 0336 08/07/21 2139 08/08/21 0415  NA 140 138 141  --  135  --  139  K 2.5* 2.3* 2.2*  --  2.9* 3.5 3.8  CL 96* 98  --   --  98  --  106  CO2 26 23  --   --  24  --  24  GLUCOSE 79 92  --   --  102*  --  108*  BUN 6 5*  --   --  5*  --  <5*  CREATININE 0.62 0.64  --   --  0.55  --  0.56  CALCIUM 7.9* 7.3*  --   --  7.2*  --  7.0*  MG 1.2*  --   --  1.5*  --   --  1.7  PHOS  --   --   --   --   --   --  1.6*    GFR: Estimated Creatinine Clearance: 81.6  mL/min (by C-G formula based on SCr of 0.56 mg/dL).  Liver Function Tests: Recent Labs  Lab 08/06/21 1550 08/07/21 0336 08/08/21 0415  AST 70* 51* 232*  ALT 16 14 21   ALKPHOS 149* 127* 122  BILITOT 1.1 1.1 1.5*  PROT 7.0 5.9* 5.5*  ALBUMIN 2.7* 2.3* 2.1*    CBG: Recent Labs  Lab 08/07/21 2202 08/07/21 2342 08/08/21 0729 08/08/21 1128 08/08/21 1615  GLUCAP 95 81 103* 83 82     Recent Results (from the past 240 hour(s))  Resp Panel by RT-PCR (Flu A&B, Covid) Nasopharyngeal Swab     Status: None   Collection Time: 08/06/21  2:15 PM   Specimen: Nasopharyngeal Swab; Nasopharyngeal(NP) swabs in vial transport medium  Result Value Ref Range Status   SARS Coronavirus 2 by RT PCR NEGATIVE NEGATIVE Final    Comment: (NOTE) SARS-CoV-2 target nucleic acids are NOT DETECTED.  The SARS-CoV-2 RNA is generally detectable in upper respiratory specimens during the acute phase of infection. The lowest concentration of SARS-CoV-2 viral copies this assay can detect is 138 copies/mL. A negative result does  not preclude SARS-Cov-2 infection and should not be used as the sole basis for treatment or other patient management decisions. A negative result may occur with  improper specimen collection/handling, submission of specimen other than nasopharyngeal swab, presence of viral mutation(s) within the areas targeted by this assay, and inadequate number of viral copies(<138 copies/mL). A negative result must be combined with clinical observations, patient history, and epidemiological information. The expected result is Negative.  Fact Sheet for Patients:  EntrepreneurPulse.com.au  Fact Sheet for Healthcare Providers:  IncredibleEmployment.be  This test is no t yet approved or cleared by the [place] FDA and  has been authorized for detection and/or diagnosis of SARS-CoV-2 by FDA under an Emergency Use Authorization (EUA). This EUA will remain  in effect (meaning this test can be used) for the duration of the COVID-19 declaration under Section 564(b)(1) of the Act, 21 U.S.C.section 360bbb-3(b)(1), unless the authorization is terminated  or revoked sooner.       Influenza A by PCR NEGATIVE NEGATIVE Final   Influenza B by PCR NEGATIVE NEGATIVE Final    Comment: (NOTE) The Xpert Xpress SARS-CoV-2/FLU/RSV plus assay is intended as an aid in the diagnosis of influenza from Nasopharyngeal swab specimens and should not be used as a sole basis for treatment. Nasal washings and aspirates are unacceptable for Xpert Xpress SARS-CoV-2/FLU/RSV testing.  Fact Sheet for Patients: EntrepreneurPulse.com.au  Fact Sheet for Healthcare Providers: IncredibleEmployment.be  This test is not yet approved or cleared by the [place] FDA and has been authorized for detection and/or diagnosis of SARS-CoV-2 by FDA under an Emergency Use Authorization (EUA). This EUA will remain in effect (meaning this test can be used) for the  duration of the COVID-19 declaration under Section 564(b)(1) of the Act, 21 U.S.C. section 360bbb-3(b)(1), unless the authorization is terminated or revoked.  Performed at [facility], [address] [place], [place] [address]   Blood culture (routine x 2)     Status: Abnormal (Preliminary result)   Collection Time: 08/06/21  7:35 PM   Specimen: BLOOD  Result Value Ref Range Status   Specimen Description   Final    BLOOD BLOOD RIGHT ARM Performed at [facility], [address] [place], [place] [address]    Special Requests   Final    BOTTLES DRAWN AEROBIC AND ANAEROBIC Blood Culture adequate volume Performed at  [facility], [address] [place], [place] [address]    Culture  Setup Time   Final    GRAM POSITIVE COCCI IN BOTH AEROBIC AND ANAEROBIC BOTTLES CRITICAL RESULT CALLED TO, READ BACK BY AND VERIFIED WITH: PHARMD MICHELLE LILLISTON  08/08/2021 @0026  BY JW    Culture (A)  Final    STAPHYLOCOCCUS EPIDERMIDIS THE SIGNIFICANCE OF ISOLATING THIS ORGANISM FROM A SINGLE VENIPUNCTURE CANNOT BE PREDICTED WITHOUT FURTHER CLINICAL AND CULTURE CORRELATION. SUSCEPTIBILITIES AVAILABLE ONLY ON REQUEST. Performed at [facility], [place] [address], [place], [place] [address]    Report Status PENDING  Incomplete  Blood Culture ID Panel (Reflexed)     Status: Abnormal   Collection Time: 08/06/21  7:35 PM  Result Value Ref Range Status   Enterococcus faecalis NOT DETECTED NOT DETECTED Final   Enterococcus Faecium NOT DETECTED NOT DETECTED Final   Listeria monocytogenes NOT DETECTED NOT DETECTED Final   Staphylococcus species DETECTED (A) NOT DETECTED Final    Comment: CRITICAL RESULT CALLED TO, READ BACK BY AND VERIFIED WITH: PHARMD MICHELLE LILLISTON  08/08/2021 @0026  BY JW    Staphylococcus aureus (BCID) NOT DETECTED NOT DETECTED Final   Staphylococcus epidermidis DETECTED (A) NOT DETECTED Final    Comment: CRITICAL RESULT CALLED TO, READ  BACK BY AND VERIFIED WITH: PHARMD MICHELLE LILLISTON  08/08/2021 @0026  BY JW    Staphylococcus lugdunensis NOT DETECTED NOT DETECTED Final   Streptococcus species NOT DETECTED NOT DETECTED Final   Streptococcus agalactiae NOT DETECTED NOT DETECTED Final   Streptococcus pneumoniae NOT DETECTED NOT DETECTED Final   Streptococcus pyogenes NOT DETECTED NOT DETECTED Final   A.calcoaceticus-baumannii NOT DETECTED NOT DETECTED Final   Bacteroides fragilis NOT DETECTED NOT DETECTED Final   Enterobacterales NOT DETECTED NOT DETECTED Final   Enterobacter cloacae complex NOT DETECTED NOT DETECTED Final   Escherichia coli NOT DETECTED NOT DETECTED Final   Klebsiella aerogenes NOT DETECTED NOT DETECTED Final   Klebsiella oxytoca NOT DETECTED NOT DETECTED Final   Klebsiella pneumoniae NOT DETECTED NOT DETECTED Final   Proteus species NOT DETECTED NOT DETECTED Final   Salmonella species NOT DETECTED NOT DETECTED Final   Serratia marcescens NOT DETECTED NOT DETECTED Final   Haemophilus influenzae NOT DETECTED NOT DETECTED Final   Neisseria meningitidis NOT DETECTED NOT DETECTED Final   Pseudomonas aeruginosa NOT DETECTED NOT DETECTED Final   Stenotrophomonas maltophilia NOT DETECTED NOT DETECTED Final   Candida albicans NOT DETECTED NOT DETECTED Final   Candida auris NOT DETECTED NOT DETECTED Final   Candida glabrata NOT DETECTED NOT DETECTED Final   Candida krusei NOT DETECTED NOT DETECTED Final   Candida parapsilosis NOT DETECTED NOT DETECTED Final   Candida tropicalis NOT DETECTED NOT DETECTED Final   Cryptococcus neoformans/gattii NOT DETECTED NOT DETECTED Final   Methicillin resistance mecA/C NOT DETECTED NOT DETECTED Final    Comment: Performed at [facility], [address] [place], [place] [address]  Blood culture (routine x 2)     Status: None (Preliminary result)   Collection Time: 08/06/21 11:17 PM   Specimen: BLOOD  Result Value Ref Range Status   Specimen Description   Final     BLOOD BLOOD LEFT FOREARM Performed at [facility] [address] [place], [place] [address]    Special Requests   Final    BOTTLES DRAWN AEROBIC ONLY Blood Culture adequate volume Performed at [facility] [address] [place],  [address]  Culture   Final    NO GROWTH 1 DAY Performed at [facility], [place] [address] [place], [place] [address]    Report Status PENDING  Incomplete  Gastrointestinal Panel by PCR , Stool     Status: None   Collection Time: 08/08/21  5:23 AM   Specimen: STOOL  Result Value Ref Range Status   Campylobacter species NOT DETECTED NOT DETECTED Final   Plesimonas shigelloides NOT DETECTED NOT DETECTED Final   Salmonella species NOT DETECTED NOT DETECTED Final   Yersinia enterocolitica NOT DETECTED NOT DETECTED Final   Vibrio species NOT DETECTED NOT DETECTED Final   Vibrio cholerae NOT DETECTED NOT DETECTED Final   Enteroaggregative E coli (EAEC) NOT DETECTED NOT DETECTED Final   Enteropathogenic E coli (EPEC) NOT DETECTED NOT DETECTED Final   Enterotoxigenic E coli (ETEC) NOT DETECTED NOT DETECTED Final   Shiga like toxin producing E coli (STEC) NOT DETECTED NOT DETECTED Final   Shigella/Enteroinvasive E coli (EIEC) NOT DETECTED NOT DETECTED Final   Cryptosporidium NOT DETECTED NOT DETECTED Final   Cyclospora cayetanensis NOT DETECTED NOT DETECTED Final   Entamoeba histolytica NOT DETECTED NOT DETECTED Final   Giardia lamblia NOT DETECTED NOT DETECTED Final   Adenovirus F40/41 NOT DETECTED NOT DETECTED Final   Astrovirus NOT DETECTED NOT DETECTED Final   Norovirus GI/GII NOT DETECTED NOT DETECTED Final   Rotavirus A NOT DETECTED NOT DETECTED Final   Sapovirus (I, II, IV, and V) NOT DETECTED NOT DETECTED Final    Comment: Performed at [facility], [address] [place], [place] [address]  C Difficile Quick Screen w PCR reflex     Status: Abnormal   Collection Time: 08/08/21  5:23  AM   Specimen: STOOL  Result Value Ref Range Status   C Diff antigen POSITIVE (A) NEGATIVE Final   C Diff toxin NEGATIVE NEGATIVE Final   C Diff interpretation Results are indeterminate. See PCR results.  Final    Comment: Performed at [facility], [place] [address], [place], [place] [address]  C. Diff by PCR, Reflexed     Status: None   Collection Time: 08/08/21  5:23 AM  Result Value Ref Range Status   Toxigenic C. Difficile by PCR NEGATIVE NEGATIVE Final    Comment: Patient is colonized with non toxigenic C. difficile. May not need treatment unless significant symptoms are present. Performed at [facility], [place] [address] [place],  [address]          Radiology Studies: [place] EKG SITE RITE  Result Date: 08/07/2021 If Site Rite image not attached, placement could not be confirmed due to current cardiac rhythm.       Scheduled Meds:  chlordiazePOXIDE  25 mg Oral QID   Followed by   Derrill Memo ON 08/09/2021] chlordiazePOXIDE  25 mg Oral TID   Followed by   Derrill Memo ON 08/10/2021] chlordiazePOXIDE  25 mg Oral BH-qamhs   Followed by   Derrill Memo ON 08/11/2021] chlordiazePOXIDE  25 mg Oral Daily   chlorhexidine  15 mL Mouth Rinse BID   Chlorhexidine Gluconate Cloth  6 each Topical Daily   folic acid  1 mg Intravenous Daily   mouth rinse  15 mL Mouth Rinse q12n4p   multivitamin with minerals  1 tablet Oral Daily   sodium chloride flush  10-40 mL Intracatheter Q12H   sucralfate  1 g Oral TID WC & HS   thiamine  100 mg Oral Daily   Or  thiamine  100 mg Intravenous Daily   vancomycin  125 mg Oral QID   Continuous Infusions:  dextrose 5 % and 0.9 % NaCl with KCl 40 mEq/L 125 mL/hr at 08/08/21 1207   levETIRAcetam 500 mg (08/08/21 1732)   metronidazole 500 mg (08/08/21 0829)   potassium PHOSPHATE IVPB (in mmol) 30 mmol (08/08/21 1213)     LOS: 1 day    Time spent: 40 minutes    Irine Seal, MD [facility]   To contact the  attending provider between 7A-7P or the covering provider during after hours 7P-7A, please log into the web site [email or web address] and access using universal  password for that web site. If you do not have the password, please call the hospital operator.  08/08/2021, 5:53 PM

## 2021-08-08 NOTE — Progress Notes (Signed)
PT Cancellation Note  Patient Details Name: Brandi Romero MRN: [id number] DOB: May 08, 1976   Cancelled Treatment:    Reason Eval/Treat Not Completed: Other (comment) Per OT note from earlier today:   Patient not medically ready. Patient is confused and has been agitated and received Ativan this morning. Patient also in posey belt with safety mittens donned. Therapist will hold evaluation until patient more appropriate for therapy evaluation in which she can demonstrate effectively her actual functional abilities.  Madelaine Etienne, DPT, PN2   Supplemental Physical Therapist [facility]    Pager [phone] Acute Rehab Office [phone]

## 2021-08-08 NOTE — TOC Progression Note (Signed)
Transition of Care [facility]) - Progression Note    Patient Details  Name: Brandi Romero MRN: [id number] Date of Birth: 02-18-1976  Transition of [facility]) CM/SW Contact  Geni Bers, RN Phone Number: 08/08/2021, 2:43 PM  Clinical Narrative:    Placed [facility] phone in AVS for pt to call for an appointment for PCP. Pt will need Substance Abuse information.         Expected Discharge Plan and Services                                                 Social Determinants of Health (SDOH) Interventions    Readmission Risk Interventions No flowsheet data found.

## 2021-08-08 NOTE — Progress Notes (Signed)
Pt increasingly agitated.  Repeated successful and unsuccessful attempts at exiting bed.  Patient extremely unsteady, 1-2 strong assist transfer between bed and BSC.  Patient attempting to pull at PICC line, repeatedly removed cardiac monitoring box and thrown at staff, Purewick removed and thrown at staff.  Staff members in patient's room since shift change.  Patient made several phone calls to unknown numbers telling them she is "walking out the hospital right now."  Patient insists that she can walk and she does not need to be in hospital.  Posey belt in place, floor mats in place.  Telesitter in place.  MD aware.  New orders for 0.5 mg Ativan, administered at 0925.  Bradd Burner, RN

## 2021-08-08 NOTE — Progress Notes (Incomplete)
This RN has offered pt her meds again tonight and was able to. Will continue to monitor

## 2021-08-08 NOTE — Progress Notes (Signed)
OT Cancellation Note  Patient Details Name: Brandi Romero MRN: [id number] DOB: [birth year]   Cancelled Treatment:    Reason Eval/Treat Not Completed: Patient not medically ready. Patient is confused and has been agitated and received Ativan this morning. Patient also in posey belt with safety mittens donned. Therapist will hold evaluation until patient more appropriate for therapy evaluation in which she can demonstrate effectively her actual functional abilities.  Danyelle [place] L Billye Nydam 08/08/2021, 12:23 PM

## 2021-08-08 NOTE — Progress Notes (Signed)
Pt refused to take her oral medications tonight and spits out her meds or drinks that is being offered to her. NP Blount made aware. Will continue to monitor.

## 2021-08-08 NOTE — Consult Note (Signed)
Referring Provider: Dr. Irine Seal Primary Care Physician:  Pcp, No Primary Gastroenterologist: [facility] [place]  Reason for Consultation: Pancolitis  HPI: Brandi Romero is a 45 y.o. female with medical history significant for alcohol abuse with history of DTs 2020, history of drug use, CHF, colon polyps, CAD status post stent placement 05/06/2020, hypertension, history of UGI bleed 04/17/2021 seen for consultation of pancolitis.  Presented to ER 11/10 with flu like symptoms, significant findings in ER: WBC 7, Latic Acid 7.5.  Hemoglobin 10.6 (at baseline), potassium 2.5,  AST 70, ALT normal, Alk phos 149, Total bili normal. Lipase normal.  ETOH level 247, positive opiates and THC.  CT abdomen pelvis showing pancolitis, hepatomegaly, poorly defined uncinate process of the pancreas, and nonspecific vague fat stranding along the bowel mesentery The liver is enlarged measuring up to 21 cm.  Patient had positive C. difficile antigen but negative toxin.   Pending PCR and gastrointestinal panel. Pending acute hepatitis  Some history received from nurse as patient is not well oriented, lethargic, agitated and uncooperative. Nurse states patient having nausea and vomiting, n.p.o. due to unable to keep anything down. Patient had last bowel movement, loose, last night, no blood, per nurse unable see documentation though. Patient continues to have abdominal pain.  Patient did seem to say she was on recent antibiotic, patient is on Rocephin here,, unknown if this is reliable. Patient denies any sick contacts at home, travel. Patient states last alcohol use was Saturday, had positive alcohol screen here. Patient denies drug use had positive THC and opioids.  EGD  04/17/2021 for anemia/GI bleed showed linear esophageal ulcer in the distal esophagus and some antral erythema compatible with gastritis.  Colonoscopy 02/17/2018, marginal prep, polyp right colon s/p snare  polypectomy , polyp 15 cm s/p hot biopsy polypectomy , internal hemorrhoids  recall age 23.  CT AB and Pelvis 08/06/2021 1. No pulmonary embolus. 2. No acute intrathoracic abnormality. 3. Pancolitis. 4. Poorly defined uncinate process of the pancreas. Consider correlating with lipase levels. 5. Hepatomegaly. 6. Nonspecific vague fat stranding along the bowel mesentery. Recommend attention on follow-up. 7.  Aortic Atherosclerosis (ICD10-I70.0).   AB [place] 04/07/2020 1. Mild diffuse gallbladder wall thickening with ring down artifact  compatible with gallbladder adenomyomatosis. Gallbladder sludge  without cholelithiasis or other signs of acute cholecystitis.  2. Hepatic steatosis  3. No biliary dilatation.      Past Medical History:  Diagnosis Date   Asthma    Seizures ([place])     Past Surgical History:  Procedure Laterality Date   CESAREAN SECTION      Prior to Admission medications   Medication Sig Start Date End Date Taking? Authorizing Provider  Cyanocobalamin (B-12) 1000 MCG TABS Take 1,000 mcg by mouth daily. 05/09/21   [provider]  ferrous sulfate 324 (65 Fe) MG TBEC Take 325 mg by mouth 2 (two) times daily. 05/09/21   [provider]  folic acid (FOLVITE) 1 MG tablet Take 1 mg by mouth daily. 05/09/21   [provider]  levETIRAcetam (KEPPRA) 500 MG tablet Take 500 mg by mouth 2 (two) times daily. 05/09/21   [provider]  losartan (COZAAR) 25 MG tablet Take 25 mg by mouth daily. 05/09/21   [provider]  pantoprazole (PROTONIX) 40 MG tablet Take 40 mg by mouth 2 (two) times daily. 05/09/21   [provider]  potassium chloride SA (KLOR-CON) 20 MEQ tablet Take 20 mEq by mouth 2 (two) times daily. 05/09/21  [provider]  sucralfate (CARAFATE) 1 g tablet Take 1 g by mouth 2 (two) times daily. 05/09/21   [provider]  temazepam (RESTORIL) 15 MG capsule Take 15 mg by mouth at bedtime as needed for  sleep. 05/17/21   [provider]  thiamine 100 MG tablet Take 100 mg by mouth daily. 05/09/21   [provider]    Scheduled Meds:  chlorhexidine  15 mL Mouth Rinse BID   Chlorhexidine Gluconate Cloth  6 each Topical Daily   folic acid  1 mg Intravenous Daily   LORazepam  0-4 mg Intravenous Q6H   Followed by   Derrill Memo ON 08/09/2021] LORazepam  0-4 mg Intravenous Q12H   mouth rinse  15 mL Mouth Rinse q12n4p   multivitamin with minerals  1 tablet Oral Daily   pantoprazole (PROTONIX) IV  40 mg Intravenous Q24H   sodium chloride flush  10-40 mL Intracatheter Q12H   thiamine  100 mg Oral Daily   Or   thiamine  100 mg Intravenous Daily   vancomycin  125 mg Oral QID   Continuous Infusions:  cefTRIAXone (ROCEPHIN)  IV 2 g (08/08/21 0346)   dextrose 5 % and 0.9 % NaCl with KCl 40 mEq/L 150 mL/hr at 08/08/21 0417   magnesium sulfate bolus IVPB     metronidazole 500 mg (08/07/21 2150)   potassium PHOSPHATE IVPB (in mmol)     PRN Meds:.albuterol, ketorolac, LORazepam, LORazepam **OR** LORazepam, morphine injection, sodium chloride flush  Allergies as of 08/06/2021   (No Known Allergies)    History reviewed. No pertinent family history.  Social History   Socioeconomic History   Marital status: Single    Spouse name: Not on file   Number of children: Not on file   Years of education: Not on file   Highest education level: Not on file  Occupational History   Not on file  Tobacco Use   Smoking status: Every Day    Types: Cigarettes   Smokeless tobacco: Never  Substance and Sexual Activity   Alcohol use: Yes   Drug use: Yes    Types: Marijuana   Sexual activity: Not on file  Other Topics Concern   Not on file  Social History Narrative   Not on file   Social Determinants of Health   Financial Resource Strain: Not on file  Food Insecurity: Not on file  Transportation Needs: Not on file  Physical Activity: Not on file  Stress: Not on file  Social  Connections: Not on file  Intimate Partner Violence: Not on file    Review of Systems:  ROS- unable to get due to AMS  Physical Exam: Vital signs: Vitals:   08/08/21 0347 08/08/21 0758  BP: (!) 127/97 (!) 128/95  Pulse: 97   Resp:  16  Temp: 97.6 F (36.4 C) 98.5 F (36.9 C)  SpO2: 96% 100%     General:   alert, will not look up from phone, trying to call someone while phone is on a website, lethargic, minimally responsive Heart:  Regular rate and rhythm; no murmurs Pulm: Clear anteriorly; no wheezing Abdomen:  Soft, Obese AB, skin exam, patient has waist restraint so unable to do skin exam,  hyperactive  bowel sounds. mild tenderness in the lower abdomen. Without guarding and Without rebound, hepatomegaly noted. Extremities:  Without edema, SCD's in place Neurologic:  Lethargic, oriented to self only but patient also not very responsive;  grossly normal neurologically, no clonus, negative asterixis bilateral  hands Psych:  Lethargic and uncooperative. Agitated mood, trying to get out of bed.   GI:  Lab Results: Recent Labs    08/07/21 0336 08/07/21 2139 08/08/21 0415  WBC 5.4 4.0 5.9  HGB 9.2* 8.2* 8.7*  HCT 30.5* 26.4* 27.9*  PLT 195 162 148*   BMET Recent Labs    08/06/21 2031 08/06/21 2124 08/07/21 0336 08/07/21 2139 08/08/21 0415  NA 138 141 135  --  139  K 2.3* 2.2* 2.9* 3.5 3.8  CL 98  --  98  --  106  CO2 23  --  24  --  24  GLUCOSE 92  --  102*  --  108*  BUN 5*  --  5*  --  <5*  CREATININE 0.64  --  0.55  --  0.56  CALCIUM 7.3*  --  7.2*  --  7.0*   LFT Recent Labs    08/08/21 0415  PROT 5.5*  ALBUMIN 2.1*  AST 232*  ALT 21  ALKPHOS 122  BILITOT 1.5*   PT/INR No results for input(s): LABPROT, INR in the last 72 hours.  Studies/Results: CT Angio Chest PE W and/or Wo Contrast  Result Date: 08/06/2021 CLINICAL DATA:  PE suspected, high prob; Abdominal pain, acute, nonlocalized. body aches , fever, nasal congestion , h/a x 1 week, c/o  chest wall pain with pro cough EXAM: CT ANGIOGRAPHY CHEST CT ABDOMEN AND PELVIS WITH CONTRAST TECHNIQUE: Multidetector CT imaging of the chest was performed using the standard protocol during bolus administration of intravenous contrast. Multiplanar CT image reconstructions and MIPs were obtained to evaluate the vascular anatomy. Multidetector CT imaging of the abdomen and pelvis was performed using the standard protocol during bolus administration of intravenous contrast. CONTRAST:  160mL OMNIPAQUE IOHEXOL 350 MG/ML SOLN COMPARISON:  None. FINDINGS: CTA CHEST FINDINGS Cardiovascular: Satisfactory opacification of the pulmonary arteries to the segmental level. No evidence of pulmonary embolism. Normal heart size. No pericardial effusion. Mediastinum/Nodes: No enlarged mediastinal, hilar, or axillary lymph nodes. Thyroid gland, trachea, and esophagus demonstrate no significant findings. Lungs/Pleura: No focal consolidation. No pulmonary nodule. No pulmonary mass. No pleural effusion. No pneumothorax. Musculoskeletal: No chest wall abnormality. No suspicious lytic or blastic osseous lesions. No acute displaced fracture. Multilevel degenerative changes of the spine. Review of the MIP images confirms the above findings. CT ABDOMEN and PELVIS FINDINGS Hepatobiliary: The liver is enlarged measuring up to 21 cm. Slightly heterogeneous hepatic parenchyma likely due to perfusion variant. No focal liver abnormality. No gallstones, gallbladder wall thickening, or pericholecystic fluid. No biliary dilatation. Pancreas: Poorly defined uncinate process. No focal lesion. Otherwise normal pancreatic contour. No surrounding inflammatory changes. No main pancreatic ductal dilatation. Spleen: Normal in size without focal abnormality. Adrenals/Urinary Tract: No adrenal nodule bilaterally. Bilateral kidneys enhance symmetrically. No hydronephrosis. No hydroureter. Urinary bladder wall thickening circumferentially likely due to under  distension no perivesicular fat stranding. Otherwise urinary bladder is unremarkable. On delayed imaging, there is no urothelial wall thickening and there are no filling defects in the opacified portions of the bilateral collecting systems or ureters. Stomach/Bowel: Stomach is within normal limits. No evidence of small bowel wall thickening or dilatation. Diffuse low-density bowel wall thickening of the colon. Slight hyperemia of the mucosa. Similar finding of the appendix. No pneumatosis. Vascular/Lymphatic: No abdominal aorta or iliac aneurysm. Mild atherosclerotic plaque of the aorta and its branches. No abdominal, pelvic, or inguinal lymphadenopathy. Reproductive: Uterus and bilateral adnexa are unremarkable. Other: Nonspecific vague fat stranding along the bowel mesentery (  5:43). No intraperitoneal free fluid. No intraperitoneal free gas. No organized fluid collection. Musculoskeletal: No abdominal wall hernia or abnormality. No suspicious lytic or blastic osseous lesions. No acute displaced fracture. Multilevel degenerative changes of the spine. Review of the MIP images confirms the above findings. IMPRESSION: 1. No pulmonary embolus. 2. No acute intrathoracic abnormality. 3. Pancolitis. 4. Poorly defined uncinate process of the pancreas. Consider correlating with lipase levels. 5. Hepatomegaly. 6. Nonspecific vague fat stranding along the bowel mesentery. Recommend attention on follow-up. 7.  Aortic Atherosclerosis (ICD10-I70.0). Electronically Signed   By: Iven Finn M.D.   On: 08/06/2021 18:09   CT Abdomen Pelvis W Contrast  Result Date: 08/06/2021 CLINICAL DATA:  PE suspected, high prob; Abdominal pain, acute, nonlocalized. body aches , fever, nasal congestion , h/a x 1 week, c/o chest wall pain with pro cough EXAM: CT ANGIOGRAPHY CHEST CT ABDOMEN AND PELVIS WITH CONTRAST TECHNIQUE: Multidetector CT imaging of the chest was performed using the standard protocol during bolus administration of  intravenous contrast. Multiplanar CT image reconstructions and MIPs were obtained to evaluate the vascular anatomy. Multidetector CT imaging of the abdomen and pelvis was performed using the standard protocol during bolus administration of intravenous contrast. CONTRAST:  139mL OMNIPAQUE IOHEXOL 350 MG/ML SOLN COMPARISON:  None. FINDINGS: CTA CHEST FINDINGS Cardiovascular: Satisfactory opacification of the pulmonary arteries to the segmental level. No evidence of pulmonary embolism. Normal heart size. No pericardial effusion. Mediastinum/Nodes: No enlarged mediastinal, hilar, or axillary lymph nodes. Thyroid gland, trachea, and esophagus demonstrate no significant findings. Lungs/Pleura: No focal consolidation. No pulmonary nodule. No pulmonary mass. No pleural effusion. No pneumothorax. Musculoskeletal: No chest wall abnormality. No suspicious lytic or blastic osseous lesions. No acute displaced fracture. Multilevel degenerative changes of the spine. Review of the MIP images confirms the above findings. CT ABDOMEN and PELVIS FINDINGS Hepatobiliary: The liver is enlarged measuring up to 21 cm. Slightly heterogeneous hepatic parenchyma likely due to perfusion variant. No focal liver abnormality. No gallstones, gallbladder wall thickening, or pericholecystic fluid. No biliary dilatation. Pancreas: Poorly defined uncinate process. No focal lesion. Otherwise normal pancreatic contour. No surrounding inflammatory changes. No main pancreatic ductal dilatation. Spleen: Normal in size without focal abnormality. Adrenals/Urinary Tract: No adrenal nodule bilaterally. Bilateral kidneys enhance symmetrically. No hydronephrosis. No hydroureter. Urinary bladder wall thickening circumferentially likely due to under distension no perivesicular fat stranding. Otherwise urinary bladder is unremarkable. On delayed imaging, there is no urothelial wall thickening and there are no filling defects in the opacified portions of the  bilateral collecting systems or ureters. Stomach/Bowel: Stomach is within normal limits. No evidence of small bowel wall thickening or dilatation. Diffuse low-density bowel wall thickening of the colon. Slight hyperemia of the mucosa. Similar finding of the appendix. No pneumatosis. Vascular/Lymphatic: No abdominal aorta or iliac aneurysm. Mild atherosclerotic plaque of the aorta and its branches. No abdominal, pelvic, or inguinal lymphadenopathy. Reproductive: Uterus and bilateral adnexa are unremarkable. Other: Nonspecific vague fat stranding along the bowel mesentery (5:43). No intraperitoneal free fluid. No intraperitoneal free gas. No organized fluid collection. Musculoskeletal: No abdominal wall hernia or abnormality. No suspicious lytic or blastic osseous lesions. No acute displaced fracture. Multilevel degenerative changes of the spine. Review of the MIP images confirms the above findings. IMPRESSION: 1. No pulmonary embolus. 2. No acute intrathoracic abnormality. 3. Pancolitis. 4. Poorly defined uncinate process of the pancreas. Consider correlating with lipase levels. 5. Hepatomegaly. 6. Nonspecific vague fat stranding along the bowel mesentery. Recommend attention on follow-up. 7.  Aortic Atherosclerosis (ICD10-I70.0).  Electronically Signed   By: Iven Finn M.D.   On: 08/06/2021 18:09   DG Chest Portable 1 View  Result Date: 08/06/2021 CLINICAL DATA:  Body aches EXAM: PORTABLE CHEST 1 VIEW COMPARISON:  Chest x-ray 04/30/2020 FINDINGS: Heart size and mediastinal contours are within normal limits. No suspicious pulmonary opacities identified. No pleural effusion or pneumothorax visualized. No acute osseous abnormality appreciated. IMPRESSION: No acute intrathoracic process identified. Electronically Signed   By: Ofilia Neas M.D.   On: 08/06/2021 15:24   [place] EKG SITE RITE  Result Date: 08/07/2021 If Site Rite image not attached, placement could not be confirmed due to current cardiac  rhythm.   Impression  Pancolitis ([place]) Colonoscopy 02/17/2018, marginal prep, polyp right colon s/p snare polypectomy , polyp 15 cm s/p hot biopsy polypectomy , internal hemorrhoids  recall age 32. Patient had positive C. difficile antigen but negative toxin.   Pending PCR and gastrointestinal panel.  Alcohol abuse with history of DTs and drug use ETOH level 247, positive opiates and THC.   Elevated LFTS AST 70, ALT normal, Alk phos 149, Total bili normal. Lipase normal.  AST 232 ALT 21 Alkphos 122 TBili 1.5 Abnormal LFTs, AST greater than ALT, normal total bili, normal ALT, normal alk phos CT scan without evidence of cirrhosis, did have hepatomegaly.  Altered mental status Likely alcohol withdrawal with history. Pending ammonia but no clonus or asterixis on exam.  Lactic acidosis On rocephin  History of GI bleed WBC 5.9 HGB 8.7 MCV 74.0 Platelets 148 Iron 168 Ferritin 49 B12 651 No IDA Baseline around 9.5-10 Slight decrease likely due to dilution    Plan If ammonia is elevated can start Xifaxan and lactulose.  LFTs appear more likely EtOH related injury Will get INR to evaluate further Pending acute hepatitis panel  Start folic acid, multivitamin and thiamine.  CIWA protocol for likely withdrawal contributing to AMS  Lactic acidosis, no leukocytosis, pancolitis with positive C. difficile antigen, possible recent antibiotic use, pending PCR - with recent normal colon 2019, we will start Vancomycin for likely Cdiff colitis.  DC ABX, can continue flagyl Avoid PPI, put on carafate Continue supportive care with fluids and pain medication  Stable H/H, recent normal EGD other than gastritis, and normal colon 2019 recall 47.  Continue to monitor H&H with transfusion as needed to maintain hemoglobin greater than 7. Can consider endoscopic evaluation if worsening anemia but unlikely at this time.    LOS: 1 day   Vladimir Crofts  PA-C 08/08/2021, 8:14 AM  Contact #   [phone]

## 2021-08-08 NOTE — Progress Notes (Addendum)
Patient was noted by RN to have hallucinations, saying her mother "was sitting right there" gesturing towards the armchair beside her.  Noted to be speaking unintelligibly to the air.  BP 149/96.  CIWA 7.  1 mg Ativan PO given per CIWA protocol. Patient also successfully swallowing first dose of vanc PO, with a total of water PO intake of 60 mL.  Patient allowed NT to give her CHG bath, RN to use suction swab to perform oral CHG hygiene.  Patient able to inform staff when she needs to use the bathroom.  Bradd Burner, RN

## 2021-08-08 NOTE — Progress Notes (Addendum)
Patient had 3 type 7 stool occurrences between 7a and 7p 11/11.  Patient had 210 mL PO from 7a-7p with no c/o nausea/vomiting.  Patient refused additional PO.  Bradd Burner, RN

## 2021-08-09 DIAGNOSIS — G9341 Metabolic encephalopathy: Secondary | ICD-10-CM

## 2021-08-09 LAB — CBC WITH DIFFERENTIAL/PLATELET
Abs Immature Granulocytes: 0.05 10*3/uL (ref 0.00–0.07)
Basophils Absolute: 0 10*3/uL (ref 0.0–0.1)
Basophils Relative: 0 %
Eosinophils Absolute: 0.1 10*3/uL (ref 0.0–0.5)
Eosinophils Relative: 2 %
HCT: 27.7 % — ABNORMAL LOW (ref 36.0–46.0)
Hemoglobin: 8.5 g/dL — ABNORMAL LOW (ref 12.0–15.0)
Immature Granulocytes: 1 %
Lymphocytes Relative: 18 %
Lymphs Abs: 0.9 10*3/uL (ref 0.7–4.0)
MCH: 22.8 pg — ABNORMAL LOW (ref 26.0–34.0)
MCHC: 30.7 g/dL (ref 30.0–36.0)
MCV: 74.5 fL — ABNORMAL LOW (ref 80.0–100.0)
Monocytes Absolute: 0.7 10*3/uL (ref 0.1–1.0)
Monocytes Relative: 14 %
Neutro Abs: 3.3 10*3/uL (ref 1.7–7.7)
Neutrophils Relative %: 65 %
Platelets: 151 10*3/uL (ref 150–400)
RBC: 3.72 MIL/uL — ABNORMAL LOW (ref 3.87–5.11)
RDW: 22.3 % — ABNORMAL HIGH (ref 11.5–15.5)
WBC: 5.1 10*3/uL (ref 4.0–10.5)
nRBC: 0.4 % — ABNORMAL HIGH (ref 0.0–0.2)

## 2021-08-09 LAB — LACTIC ACID, PLASMA: Lactic Acid, Venous: 3 mmol/L (ref 0.5–1.9)

## 2021-08-09 LAB — COMPREHENSIVE METABOLIC PANEL
ALT: 20 U/L (ref 0–44)
AST: 90 U/L — ABNORMAL HIGH (ref 15–41)
Albumin: 2.4 g/dL — ABNORMAL LOW (ref 3.5–5.0)
Alkaline Phosphatase: 136 U/L — ABNORMAL HIGH (ref 38–126)
Anion gap: 6 (ref 5–15)
BUN: <5 mg/dL — ABNORMAL LOW (ref 6–20)
CO2: 24 mmol/L (ref 22–32)
Calcium: 7.7 mg/dL — ABNORMAL LOW (ref 8.9–10.3)
Chloride: 106 mmol/L (ref 98–111)
Creatinine, Ser: 0.43 mg/dL — ABNORMAL LOW (ref 0.44–1.00)
GFR, Estimated: >60 mL/min (ref >60)
Glucose, Bld: 97 mg/dL (ref 70–99)
Potassium: 4.8 mmol/L (ref 3.5–5.1)
Sodium: 136 mmol/L (ref 135–145)
Total Bilirubin: 1.5 mg/dL — ABNORMAL HIGH (ref 0.3–1.2)
Total Protein: 6.2 g/dL — ABNORMAL LOW (ref 6.5–8.1)

## 2021-08-09 LAB — CULTURE, BLOOD (ROUTINE X 2): Special Requests: ADEQUATE

## 2021-08-09 LAB — MAGNESIUM: Magnesium: 1.6 mg/dL — ABNORMAL LOW (ref 1.7–2.4)

## 2021-08-09 LAB — PHOSPHORUS: Phosphorus: 1.9 mg/dL — ABNORMAL LOW (ref 2.5–4.6)

## 2021-08-09 LAB — GLUCOSE, CAPILLARY
Glucose-Capillary: 74 mg/dL (ref 70–99)
Glucose-Capillary: 81 mg/dL (ref 70–99)
Glucose-Capillary: 83 mg/dL (ref 70–99)
Glucose-Capillary: 87 mg/dL (ref 70–99)
Glucose-Capillary: 89 mg/dL (ref 70–99)
Glucose-Capillary: 90 mg/dL (ref 70–99)

## 2021-08-09 MED ORDER — THIAMINE HCL 100 MG PO TABS: 100.0000 mg | ORAL_TABLET | Freq: Every day | ORAL | Status: DC

## 2021-08-09 MED ORDER — MAGNESIUM SULFATE 4 GM/100ML IV SOLN
4.0000 g | Freq: Once | INTRAVENOUS | Status: AC
  Administered 2021-08-09: 4 g via INTRAVENOUS
  Filled 2021-08-09: qty 100

## 2021-08-09 MED ORDER — THIAMINE HCL 100 MG/ML IJ SOLN: 250.0000 mg | Freq: Every day | INTRAVENOUS | Status: DC

## 2021-08-09 MED ORDER — THIAMINE HCL 100 MG/ML IJ SOLN
500.0000 mg | Freq: Every day | INTRAVENOUS | Status: DC
  Administered 2021-08-09: 500 mg via INTRAVENOUS
  Filled 2021-08-09 (×2): qty 5

## 2021-08-09 MED ORDER — SODIUM PHOSPHATES 45 MMOLE/15ML IV SOLN
30.0000 mmol | Freq: Once | INTRAVENOUS | Status: AC
  Administered 2021-08-09: 30 mmol via INTRAVENOUS
  Filled 2021-08-09: qty 10

## 2021-08-09 MED ORDER — THIAMINE HCL 100 MG/ML IJ SOLN: 100.0000 mg | Freq: Every day | INTRAMUSCULAR | Status: DC

## 2021-08-09 NOTE — Progress Notes (Signed)
OT Cancellation Note  Patient Details Name: Brandi Romero MRN: [id number] DOB: 12-Jul-1976   Cancelled Treatment:    Reason Eval/Treat Not Completed: Patient not medically ready. Remains restrained and not following commands.   Evern Bio 08/09/2021, 11:02 AM Martie Round, OTR/L Acute Rehabilitation Services Pager: [phone] Office: [phone]

## 2021-08-09 NOTE — Progress Notes (Signed)
PT Cancellation Note  Patient Details Name: Brandi Romero MRN: [id number] DOB: 05-10-1976   Cancelled Treatment:    Reason Eval/Treat Not Completed: Medical issues which prohibited therapy Pt remains in restraints and not following commands per RN.  Will likely check back on Monday for evaluation.   [name] 08/09/2021, 9:36 AM Thomasene Mohair PT, DPT Acute Rehabilitation Services Pager: [phone] Office: [phone]

## 2021-08-09 NOTE — Progress Notes (Signed)
Brandi Romero 10:34 AM  Subjective: Patient seen and examined and discussed with her nurse and case discussed with my partner Dr. Therisa Doyne in her hospital computer chart reviewed and unfortunately she is too lethargic and cannot respond to questions and did get some Ativan this morning  Objective: Vital signs stable afebrile no acute distress abdomen is soft nontender electrolytes okay slight increase in alk phos decrease in AST ammonia okay CBC stable INR slight increase iron studies compatible with chronic disease  Assessment: Alcohol abuse and C. difficile multiple other issues  Plan: Will check on tomorrow otherwise continue present management and might need a brain scan or neurologic evaluation if mental status continues  [facility]  office [phone] After 5PM or if no answer call [phone]

## 2021-08-09 NOTE — Progress Notes (Signed)
PROGRESS NOTE    Brandi Romero  [id number] DOB: Apr 26, 1976 DOA: 08/06/2021 PCP: Pcp, No   Chief Complaint  Patient presents with   Generalized Body Aches    Brief Narrative:  Patient 45 year old female history of asthma, seizure disorder, alcohol use, marijuana use, PUD, GI bleed, anemia, colonic polyps presenting to the ED with one 1 week history of flulike symptoms with body aches, cough, shortness of breath, chest pain, nausea vomiting generalized abdominal pain.  In the ED patient noted to have a CBG of 56.  CT angiogram chest done negative for PE.  CT abdomen and pelvis concerning for pancolitis, hepatomegaly, poorly defined uncinate process of the pancreas, nonspecific vague fat stranding along with bowel mesentery.  Patient admitted, placed empirically on IV antibiotics, PPI, supportive care.  Patient noted to be agitated confused likely in alcohol withdrawal and placed on Ativan withdrawal protocol.  Librium detox protocol added to regiment.  Patient also noted to have QTC prolongation electrolyte abnormalities.  Due to worsening transaminitis and pancolitis GI consulted for further evaluation and management.   Assessment & Plan:   Principal Problem:   Pancolitis ([place]) Active Problems:   Alcohol withdrawal ([facility])   Hypokalemia   Hypomagnesemia   QT prolongation   Hypoglycemia   Alcohol abuse   Transaminitis   Acute metabolic encephalopathy  #1 pancolitis -Patient presented with generalized abdominal pain, diarrhea, intractable nausea and vomiting noted to have a significant lactic acidosis also noted to be significantly dehydrated. -No leukocytosis noted. -UDS done positive for THC which was found may be contributing to nausea and vomiting. -Patient also noted to have an elevated alcohol level on admission. -CT abdomen and pelvis done concerning for pancolitis. -C. difficile PCR done with a positive C. difficile antigen, negative toxin, PCR negative. -Patient however  presented with a clinical colitis with CT evidence of pancolitis and as such patient started on oral vancomycin. -Continue IV Flagyl. -Patient seen in consultation by GI who are recommending discontinuation of IV Rocephin which has been done. -IV fluids, supportive care.  2.  Transaminitis -Likely secondary to alcohol use. -Patient with history of alcohol use and currently in alcohol withdrawal. -CT abdomen and pelvis done with hepatomegaly, poorly defined uncinate process of the pancreas, no gallstones, no gallbladder wall thickening, no pericholecystic fluid, no biliary dilatation. -Acute hepatitis panel negative. -HIV negative. -INR of 1.4. -GI consulted and few transaminitis likely secondary to alcoholic hepatitis. -Supportive care.  3.  QTC prolongation -Replete electrolytes, keep magnesium around 2, potassium around 4. -Repeat EKG still with QT prolongation.   -Repeat EKG in 48 hours after electrolytes have been repleted.    4.  Hypokalemia/hypomagnesemia/hypophosphatemia -Replete.  5.  Alcohol withdrawal syndrome -Patient noted to be agitated, some confusion, likely in alcohol withdrawal. -Alcohol level on admission was 247. -Patient was on the Ativan withdrawal protocol. -Librium detox protocol added to current regimen.   -Place on high-dose thiamine 500 mg IV daily x3 days, then 250 mg IV daily x3 days, then thiamine 100 mg daily.  -IV fluids, multivitamin, thiamine, folic acid. -TOC consulted.  6.  Microcytic anemia -Likely dilutional and also secondary to folate deficiency and iron deficiency in menstruating female. -Patient with no overt bleeding. -Anemia panel consistent with folate deficiency. -Folic acid daily. -Transfusion threshold hemoglobin < 7.  -Follow H&H.  7.  Hypoglycemia -Likely secondary to poor oral intake. -Patient not on insulin or oral hypoglycemic agents prior to admission. -Continue D5 normal saline. -Follow.  8.  Seizure  disorder -Patient  noted to be on Keppra prior to admission. -Patient received a dose of IV Keppra in the ED. -Continue Keppra 500 mg IV every 12 hours until able to tolerate oral intake adequately and could transition back to home regimen of oral Keppra.   9.  Asthma -Stable.  10.  Peptic ulcer disease -Continue PPI.  11.  Acute metabolic encephalopathy -Likely secondary to alcohol withdrawal. -Alcohol level within normal limits. -Check a thiamine level. -Placed on thiamine 500 mg IV daily x3 days, then thiamine 250 mg IV daily x3 days, then thiamine 100 mg daily. -   DVT prophylaxis: SCDs Code Status: Full Family Communication: No family at bedside Disposition:   Status is: Inpatient  Remains inpatient appropriate because: Severity of illness       Consultants:  GI: Dr. Therisa Doyne 08/08/2021  Procedures:  CT angiogram chest 08/06/2021 CT abdomen and pelvis 08/06/2021 Chest x-ray 08/06/2021   Antimicrobials: Oral vancomycin 08/08/2021>>>>> IV Rocephin 08/07/2021>>>>> 08/08/2021 IV Flagyl 08/07/2021>>>>>   Subjective: Patient somnolent.  Sitting up in bed.  No chest pain.  No shortness of breath.  Per nurse tech  patient states patient had watery loose stools this morning.  No emesis.  Tolerating current diet.    Objective: Vitals:   08/08/21 1734 08/08/21 2024 08/09/21 0500 08/09/21 1104  BP: (!) 158/111 (!) 159/116 (!) 157/117 (!) 154/118  Pulse: 98 (!) 102 (!) 105 (!) 103  Resp:  18 19 20   Temp:  98.7 F (37.1 C) 98.5 F (36.9 C) 97.9 F (36.6 C)  TempSrc:  Oral Oral Oral  SpO2:  100% 98% 96%  Weight:      Height:        Intake/Output Summary (Last 24 hours) at 08/09/2021 1554 Last data filed at 08/09/2021 1549 [place] per 24 hour  Intake 4101.21 ml  Output 2650 ml  Net 1451.21 ml   Filed Weights   08/06/21 1415  Weight: 59 kg    Examination:  General exam: Drowsy. Respiratory system: CTA B anterior lung fields.  No wheezes, no crackles, no  rhonchi.  Normal respiratory effort Cardiovascular system: Regular rate and rhythm no murmurs rubs or gallops.  No JVD.  No lower extremity edema.   Gastrointestinal system: Abdomen is soft, nondistended, decreased diffuse tenderness to palpation, positive bowel sounds.  No rebound.  No guarding.  Central nervous system: Alert and oriented. No focal neurological deficits. Extremities: Symmetric 5 x 5 power. Skin: No rashes, lesions or ulcers Psychiatry: Judgement and insight appear poor.. Mood & affect unable to assess.     Data Reviewed: I have personally reviewed following labs and imaging studies  CBC: Recent Labs  Lab 08/06/21 1559 08/06/21 2124 08/07/21 0336 08/07/21 2139 08/08/21 0415 08/09/21 0334  WBC 7.0  --  5.4 4.0 5.9 5.1  NEUTROABS  --   --  3.4 2.4 4.8 3.3  HGB 10.6* 10.9* 9.2* 8.2* 8.7* 8.5*  HCT 33.6* 32.0* 30.5* 26.4* 27.9* 27.7*  MCV 72.7*  --  75.3* 73.7* 74.0* 74.5*  PLT 235  --  195 162 148* [id number]    Basic Metabolic Panel: Recent Labs  Lab 08/06/21 1550 08/06/21 2031 08/06/21 2124 08/07/21 0334 08/07/21 0336 08/07/21 2139 08/08/21 0415 08/09/21 0334  NA 140 138 141  --  135  --  139 136  K 2.5* 2.3* 2.2*  --  2.9* 3.5 3.8 4.8  CL 96* 98  --   --  98  --  106 106  CO2 26 23  --   --  24  --  24 24  GLUCOSE 79 92  --   --  102*  --  108* 97  BUN 6 5*  --   --  5*  --  <5* <5*  CREATININE 0.62 0.64  --   --  0.55  --  0.56 0.43*  CALCIUM 7.9* 7.3*  --   --  7.2*  --  7.0* 7.7*  MG 1.2*  --   --  1.5*  --   --  1.7 1.6*  PHOS  --   --   --   --   --   --  1.6* 1.9*    GFR: Estimated Creatinine Clearance: 81.6 mL/min (A) (by C-G formula based on SCr of 0.43 mg/dL (L)).  Liver Function Tests: Recent Labs  Lab 08/06/21 1550 08/07/21 0336 08/08/21 0415 08/09/21 0334  AST 70* 51* 232* 90*  ALT 16 14 21 20   ALKPHOS 149* 127* 122 136*  BILITOT 1.1 1.1 1.5* 1.5*  PROT 7.0 5.9* 5.5* 6.2*  ALBUMIN 2.7* 2.3* 2.1* 2.4*    CBG: Recent Labs   Lab 08/08/21 2018 08/08/21 2338 08/09/21 0324 08/09/21 0720 08/09/21 1106  GLUCAP 75 81 89 81 83     Recent Results (from the past 240 hour(s))  Resp Panel by RT-PCR (Flu A&B, Covid) Nasopharyngeal Swab     Status: None   Collection Time: 08/06/21  2:15 PM   Specimen: Nasopharyngeal Swab; Nasopharyngeal(NP) swabs in vial transport medium  Result Value Ref Range Status   SARS Coronavirus 2 by RT PCR NEGATIVE NEGATIVE Final    Comment: (NOTE) SARS-CoV-2 target nucleic acids are NOT DETECTED.  The SARS-CoV-2 RNA is generally detectable in upper respiratory specimens during the acute phase of infection. The lowest concentration of SARS-CoV-2 viral copies this assay can detect is 138 copies/mL. A negative result does not preclude SARS-Cov-2 infection and should not be used as the sole basis for treatment or other patient management decisions. A negative result may occur with  improper specimen collection/handling, submission of specimen other than nasopharyngeal swab, presence of viral mutation(s) within the areas targeted by this assay, and inadequate number of viral copies(<138 copies/mL). A negative result must be combined with clinical observations, patient history, and epidemiological information. The expected result is Negative.  Fact Sheet for Patients:  13/09/22  Fact Sheet for Healthcare Providers:  BloggerCourse.com  This test is no t yet approved or cleared by the [employer] FDA and  has been authorized for detection and/or diagnosis of SARS-CoV-2 by FDA under an Emergency Use Authorization (EUA). This EUA will remain  in effect (meaning this test can be used) for the duration of the COVID-19 declaration under Section 564(b)(1) of the Act, 21 U.S.C.section 360bbb-3(b)(1), unless the authorization is terminated  or revoked sooner.       Influenza A by PCR NEGATIVE NEGATIVE Final   Influenza B by PCR  NEGATIVE NEGATIVE Final    Comment: (NOTE) The Xpert Xpress SARS-CoV-2/FLU/RSV plus assay is intended as an aid in the diagnosis of influenza from Nasopharyngeal swab specimens and should not be used as a sole basis for treatment. Nasal washings and aspirates are unacceptable for Xpert Xpress SARS-CoV-2/FLU/RSV testing.  Fact Sheet for Patients: [place]  Fact Sheet for Healthcare Providers: BloggerCourse.com  This test is not yet approved or cleared by the [employer] FDA and has been authorized for detection and/or diagnosis of SARS-CoV-2 by FDA under an Emergency Use Authorization (EUA). This EUA will remain in effect (  meaning this test can be used) for the duration of the COVID-19 declaration under Section 564(b)(1) of the Act, 21 U.S.C. section 360bbb-3(b)(1), unless the authorization is terminated or revoked.  Performed at [facility], [address] [place], [place] [address]   Blood culture (routine x 2)     Status: Abnormal   Collection Time: 08/06/21  7:35 PM   Specimen: BLOOD  Result Value Ref Range Status   Specimen Description   Final    BLOOD BLOOD RIGHT ARM Performed at [facility], [address] [place], [place] [address]    Special Requests   Final    BOTTLES DRAWN AEROBIC AND ANAEROBIC Blood Culture adequate volume Performed at [facility], [address] [place], [place] [address]    Culture  Setup Time   Final    GRAM POSITIVE COCCI IN BOTH AEROBIC AND ANAEROBIC BOTTLES CRITICAL RESULT CALLED TO, READ BACK BY AND VERIFIED WITH: PHARMD MICHELLE LILLISTON  08/08/2021 @0026  BY JW    Culture (A)  Final    STAPHYLOCOCCUS EPIDERMIDIS THE SIGNIFICANCE OF ISOLATING THIS ORGANISM FROM A SINGLE VENIPUNCTURE CANNOT BE PREDICTED WITHOUT FURTHER CLINICAL AND CULTURE CORRELATION. SUSCEPTIBILITIES AVAILABLE ONLY ON REQUEST. Performed at [facility], [place]  [address] [place], [place] [address]    Report Status 08/09/2021 FINAL  Final  Blood Culture ID Panel (Reflexed)     Status: Abnormal   Collection Time: 08/06/21  7:35 PM  Result Value Ref Range Status   Enterococcus faecalis NOT DETECTED NOT DETECTED Final   Enterococcus Faecium NOT DETECTED NOT DETECTED Final   Listeria monocytogenes NOT DETECTED NOT DETECTED Final   Staphylococcus species DETECTED (A) NOT DETECTED Final    Comment: CRITICAL RESULT CALLED TO, READ BACK BY AND VERIFIED WITH: PHARMD MICHELLE LILLISTON  08/08/2021 @0026  BY JW    Staphylococcus aureus (BCID) NOT DETECTED NOT DETECTED Final   Staphylococcus epidermidis DETECTED (A) NOT DETECTED Final    Comment: CRITICAL RESULT CALLED TO, READ BACK BY AND VERIFIED WITH: PHARMD MICHELLE LILLISTON  08/08/2021 @0026  BY JW    Staphylococcus lugdunensis NOT DETECTED NOT DETECTED Final   Streptococcus species NOT DETECTED NOT DETECTED Final   Streptococcus agalactiae NOT DETECTED NOT DETECTED Final   Streptococcus pneumoniae NOT DETECTED NOT DETECTED Final   Streptococcus pyogenes NOT DETECTED NOT DETECTED Final   A.calcoaceticus-baumannii NOT DETECTED NOT DETECTED Final   Bacteroides fragilis NOT DETECTED NOT DETECTED Final   Enterobacterales NOT DETECTED NOT DETECTED Final   Enterobacter cloacae complex NOT DETECTED NOT DETECTED Final   Escherichia coli NOT DETECTED NOT DETECTED Final   Klebsiella aerogenes NOT DETECTED NOT DETECTED Final   Klebsiella oxytoca NOT DETECTED NOT DETECTED Final   Klebsiella pneumoniae NOT DETECTED NOT DETECTED Final   Proteus species NOT DETECTED NOT DETECTED Final   Salmonella species NOT DETECTED NOT DETECTED Final   Serratia marcescens NOT DETECTED NOT DETECTED Final   Haemophilus influenzae NOT DETECTED NOT DETECTED Final   Neisseria meningitidis NOT DETECTED NOT DETECTED Final   Pseudomonas aeruginosa NOT DETECTED NOT DETECTED Final   Stenotrophomonas maltophilia NOT DETECTED NOT DETECTED  Final   Candida albicans NOT DETECTED NOT DETECTED Final   Candida auris NOT DETECTED NOT DETECTED Final   Candida glabrata NOT DETECTED NOT DETECTED Final   Candida krusei NOT DETECTED NOT DETECTED Final   Candida parapsilosis NOT DETECTED NOT DETECTED Final   Candida tropicalis NOT DETECTED NOT DETECTED Final  Cryptococcus neoformans/gattii NOT DETECTED NOT DETECTED Final   Methicillin resistance mecA/C NOT DETECTED NOT DETECTED Final    Comment: Performed at [facility], [place] [address] [place], [place] [address]  Blood culture (routine x 2)     Status: None (Preliminary result)   Collection Time: 08/06/21 11:17 PM   Specimen: BLOOD  Result Value Ref Range Status   Specimen Description   Final    BLOOD BLOOD LEFT FOREARM Performed at [facility] [address], [place], [place] [address]    Special Requests   Final    BOTTLES DRAWN AEROBIC ONLY Blood Culture adequate volume Performed at [facility] [address], [place], [place] [address]    Culture   Final    NO GROWTH 2 DAYS Performed at [facility] [address], [place], [place] [address]    Report Status PENDING  Incomplete  Gastrointestinal Panel by PCR , Stool     Status: None   Collection Time: 08/08/21  5:23 AM   Specimen: STOOL  Result Value Ref Range Status   Campylobacter species NOT DETECTED NOT DETECTED Final   Plesimonas shigelloides NOT DETECTED NOT DETECTED Final   Salmonella species NOT DETECTED NOT DETECTED Final   Yersinia enterocolitica NOT DETECTED NOT DETECTED Final   Vibrio species NOT DETECTED NOT DETECTED Final   Vibrio cholerae NOT DETECTED NOT DETECTED Final   Enteroaggregative E coli (EAEC) NOT DETECTED NOT DETECTED Final   Enteropathogenic E coli (EPEC) NOT DETECTED NOT DETECTED Final   Enterotoxigenic E coli (ETEC) NOT DETECTED NOT DETECTED Final   Shiga like toxin producing E coli (STEC) NOT DETECTED NOT DETECTED Final    Shigella/Enteroinvasive E coli (EIEC) NOT DETECTED NOT DETECTED Final   Cryptosporidium NOT DETECTED NOT DETECTED Final   Cyclospora cayetanensis NOT DETECTED NOT DETECTED Final   Entamoeba histolytica NOT DETECTED NOT DETECTED Final   Giardia lamblia NOT DETECTED NOT DETECTED Final   Adenovirus F40/41 NOT DETECTED NOT DETECTED Final   Astrovirus NOT DETECTED NOT DETECTED Final   Norovirus GI/GII NOT DETECTED NOT DETECTED Final   Rotavirus A NOT DETECTED NOT DETECTED Final   Sapovirus (I, II, IV, and V) NOT DETECTED NOT DETECTED Final    Comment: Performed at [facility], [address] [place], [place] [address]  C Difficile Quick Screen w PCR reflex     Status: Abnormal   Collection Time: 08/08/21  5:23 AM   Specimen: STOOL  Result Value Ref Range Status   C Diff antigen POSITIVE (A) NEGATIVE Final   C Diff toxin NEGATIVE NEGATIVE Final   C Diff interpretation Results are indeterminate. See PCR results.  Final    Comment: Performed at [facility], [place] [address], [place], [place] [address]  C. Diff by PCR, Reflexed     Status: None   Collection Time: 08/08/21  5:23 AM  Result Value Ref Range Status   Toxigenic C. Difficile by PCR NEGATIVE NEGATIVE Final    Comment: Patient is colonized with non toxigenic C. difficile. May not need treatment unless significant symptoms are present. Performed at [facility], [place] [address], [place], [place] [address]          Radiology Studies: No results found.      Scheduled Meds:  chlordiazePOXIDE  25 mg Oral TID   Followed by   Derrill Memo ON 08/10/2021] chlordiazePOXIDE  25 mg Oral BH-qamhs   Followed by   [  START ON 08/11/2021] chlordiazePOXIDE  25 mg Oral Daily   chlorhexidine  15 mL Mouth Rinse BID   Chlorhexidine Gluconate Cloth  6 each Topical Daily   folic acid  1 mg Intravenous Daily   mouth rinse  15 mL Mouth Rinse q12n4p   multivitamin with minerals  1 tablet Oral Daily   sodium chloride  flush  10-40 mL Intracatheter Q12H   sucralfate  1 g Oral TID WC & HS   thiamine  100 mg Oral Daily   Or   thiamine  100 mg Intravenous Daily   vancomycin  125 mg Oral QID   Continuous Infusions:  dextrose 5 % and 0.9 % NaCl with KCl 40 mEq/L 125 mL/hr at 08/09/21 1245   levETIRAcetam 500 mg (08/09/21 0542)   metronidazole 500 mg (08/09/21 1027)   sodium phosphate  Dextrose 5% IVPB 30 mmol (08/09/21 1243)     LOS: 2 days    Time spent: 40 minutes    Irine Seal, MD [facility]   To contact the attending provider between 7A-7P or the covering provider during after hours 7P-7A, please log into the web site [email or web address] and access using universal  password for that web site. If you do not have the password, please call the hospital operator.  08/09/2021, 3:54 PM

## 2021-08-10 LAB — GLUCOSE, CAPILLARY
Glucose-Capillary: 87 mg/dL (ref 70–99)
Glucose-Capillary: 95 mg/dL (ref 70–99)
Glucose-Capillary: 95 mg/dL (ref 70–99)
Glucose-Capillary: 88 mg/dL (ref 70–99)
Glucose-Capillary: 82 mg/dL (ref 70–99)

## 2021-08-10 LAB — COMPREHENSIVE METABOLIC PANEL
ALT: 15 U/L (ref 0–44)
AST: 38 U/L (ref 15–41)
Albumin: 2.6 g/dL — ABNORMAL LOW (ref 3.5–5.0)
Alkaline Phosphatase: 126 U/L (ref 38–126)
Anion gap: 4 — ABNORMAL LOW (ref 5–15)
BUN: <5 mg/dL — ABNORMAL LOW (ref 6–20)
CO2: 24 mmol/L (ref 22–32)
Calcium: 8 mg/dL — ABNORMAL LOW (ref 8.9–10.3)
Chloride: 109 mmol/L (ref 98–111)
Creatinine, Ser: 0.41 mg/dL — ABNORMAL LOW (ref 0.44–1.00)
GFR, Estimated: >60 mL/min (ref >60)
Glucose, Bld: 98 mg/dL (ref 70–99)
Potassium: 4.9 mmol/L (ref 3.5–5.1)
Sodium: 137 mmol/L (ref 135–145)
Total Bilirubin: 1.4 mg/dL — ABNORMAL HIGH (ref 0.3–1.2)
Total Protein: 6.5 g/dL (ref 6.5–8.1)

## 2021-08-10 LAB — CBC WITH DIFFERENTIAL/PLATELET
Abs Immature Granulocytes: 0.04 10*3/uL (ref 0.00–0.07)
Basophils Absolute: 0 10*3/uL (ref 0.0–0.1)
Basophils Relative: 0 %
Eosinophils Absolute: 0.1 10*3/uL (ref 0.0–0.5)
Eosinophils Relative: 1 %
HCT: 27.3 % — ABNORMAL LOW (ref 36.0–46.0)
Hemoglobin: 8.5 g/dL — ABNORMAL LOW (ref 12.0–15.0)
Immature Granulocytes: 1 %
Lymphocytes Relative: 17 %
Lymphs Abs: 1.2 10*3/uL (ref 0.7–4.0)
MCH: 22.9 pg — ABNORMAL LOW (ref 26.0–34.0)
MCHC: 31.1 g/dL (ref 30.0–36.0)
MCV: 73.6 fL — ABNORMAL LOW (ref 80.0–100.0)
Monocytes Absolute: 1 10*3/uL (ref 0.1–1.0)
Monocytes Relative: 15 %
Neutro Abs: 4.4 10*3/uL (ref 1.7–7.7)
Neutrophils Relative %: 66 %
Platelets: 168 10*3/uL (ref 150–400)
RBC: 3.71 MIL/uL — ABNORMAL LOW (ref 3.87–5.11)
RDW: 22.5 % — ABNORMAL HIGH (ref 11.5–15.5)
WBC: 6.7 10*3/uL (ref 4.0–10.5)
nRBC: 0 % (ref 0.0–0.2)

## 2021-08-10 LAB — PHOSPHORUS: Phosphorus: 2.8 mg/dL (ref 2.5–4.6)

## 2021-08-10 LAB — MAGNESIUM: Magnesium: 1.7 mg/dL (ref 1.7–2.4)

## 2021-08-10 MED ORDER — THIAMINE HCL 100 MG/ML IJ SOLN
250.0000 mg | Freq: Three times a day (TID) | INTRAVENOUS | Status: DC
  Administered 2021-08-15 (×3): 250 mg via INTRAVENOUS
  Filled 2021-08-10 (×7): qty 2.5

## 2021-08-10 MED ORDER — THIAMINE HCL 100 MG/ML IJ SOLN
500.0000 mg | Freq: Three times a day (TID) | INTRAVENOUS | Status: AC
  Administered 2021-08-10 – 2021-08-14 (×13): 500 mg via INTRAVENOUS
  Filled 2021-08-10 (×16): qty 5

## 2021-08-10 MED ORDER — LORAZEPAM 1 MG PO TABS
0.0000 mg | ORAL_TABLET | Freq: Two times a day (BID) | ORAL | Status: DC
  Administered 2021-08-12: 1 mg via ORAL
  Filled 2021-08-10: qty 1

## 2021-08-10 MED ORDER — FOLIC ACID 1 MG PO TABS
1.0000 mg | ORAL_TABLET | Freq: Every day | ORAL | Status: DC
  Administered 2021-08-12 – 2021-08-16 (×5): 1 mg via ORAL
  Filled 2021-08-10 (×6): qty 1

## 2021-08-10 MED ORDER — THIAMINE HCL 100 MG/ML IJ SOLN: 100.0000 mg | Freq: Every day | INTRAMUSCULAR | Status: DC

## 2021-08-10 MED ORDER — ADULT MULTIVITAMIN W/MINERALS CH: 1.0000 | ORAL_TABLET | Freq: Every day | ORAL | Status: DC

## 2021-08-10 MED ORDER — LORAZEPAM 1 MG PO TABS
1.0000 mg | ORAL_TABLET | ORAL | Status: AC | PRN
  Administered 2021-08-12: 3 mg via ORAL
  Administered 2021-08-13: 1 mg via ORAL
  Filled 2021-08-10: qty 4
  Filled 2021-08-10: qty 1
  Filled 2021-08-10: qty 3

## 2021-08-10 MED ORDER — THIAMINE HCL 100 MG PO TABS: 100.0000 mg | ORAL_TABLET | Freq: Every day | ORAL | Status: DC

## 2021-08-10 MED ORDER — LORAZEPAM 1 MG PO TABS
0.0000 mg | ORAL_TABLET | Freq: Four times a day (QID) | ORAL | Status: DC
  Administered 2021-08-11: 2 mg via ORAL
  Administered 2021-08-12 (×2): 1 mg via ORAL
  Filled 2021-08-10: qty 2
  Filled 2021-08-10 (×2): qty 1

## 2021-08-10 MED ORDER — NICOTINE 21 MG/24HR TD PT24
21.0000 mg | MEDICATED_PATCH | Freq: Every day | TRANSDERMAL | Status: DC
  Administered 2021-08-10 – 2021-08-15 (×6): 21 mg via TRANSDERMAL
  Filled 2021-08-10 (×7): qty 1

## 2021-08-10 MED ORDER — MAGNESIUM SULFATE 4 GM/100ML IV SOLN
4.0000 g | Freq: Once | INTRAVENOUS | Status: AC
  Administered 2021-08-10: 4 g via INTRAVENOUS
  Filled 2021-08-10: qty 100

## 2021-08-10 MED ORDER — FAMOTIDINE IN NACL 20-0.9 MG/50ML-% IV SOLN
20.0000 mg | Freq: Two times a day (BID) | INTRAVENOUS | Status: DC
  Administered 2021-08-10 – 2021-08-14 (×8): 20 mg via INTRAVENOUS
  Filled 2021-08-10 (×9): qty 50

## 2021-08-10 MED ORDER — LORAZEPAM 2 MG/ML IJ SOLN
1.0000 mg | INTRAMUSCULAR | Status: AC | PRN
  Administered 2021-08-10 – 2021-08-11 (×2): 2 mg via INTRAVENOUS
  Administered 2021-08-11: 4 mg via INTRAVENOUS
  Administered 2021-08-12: 2 mg via INTRAVENOUS
  Filled 2021-08-10: qty 2
  Filled 2021-08-10 (×3): qty 1

## 2021-08-10 NOTE — Progress Notes (Signed)
Soft wrist and lap belt restraints were discontinued and taken off of patient today. Patient met criteria to discontinue restraints. MD was notified about discontinuation of restraints. Floor mats, bed alarms, and frequent observations were still implemented during the shift. Will continue to monitor patient.

## 2021-08-10 NOTE — Progress Notes (Signed)
OT Cancellation Note  Patient Details Name: Brandi Romero MRN: [id number] DOB: 1976/05/27   Cancelled Treatment:    Reason Eval/Treat Not Completed: Patient not medically ready Patient was noted to have ativan this Am with patient unable to participate in ADL tasks at this time. OT will continue to follow and check back as schedule allows.   Sharyn Blitz OTR/L, MS Acute Rehabilitation Department Office# [phone] Pager# [phone]    08/10/2021, 8:34 AM

## 2021-08-10 NOTE — Progress Notes (Addendum)
   08/10/21 1813  What Happened  Was fall witnessed? No  Was patient injured? No  Patient found on floor (on Floor mat)  Found by Staff-comment (RN)  Stated prior activity other (comment) (in bed)  Follow Up  MD notified Ramiro Harvest MD  Time MD notified [phone]  Family notified Yes - comment  Time family notified 1827  Additional tests No  Simple treatment Other (comment) (Assess Vital signs and pain)  Adult Fall Risk Assessment  Risk Factor Category (scoring not indicated) High fall risk per protocol (document High fall risk)  Age 45  Fall History: Fall within 6 months prior to admission 0  Elimination; Bowel and/or Urine Incontinence 2  Elimination; Bowel and/or Urine Urgency/Frequency 0  Medications: includes PCA/Opiates, Anti-convulsants, Anti-hypertensives, Diuretics, Hypnotics, Laxatives, Sedatives, and Psychotropics 3  Patient Care Equipment 2  Mobility-Assistance 2  Mobility-Gait 2  Mobility-Sensory Deficit 2  Altered awareness of immediate physical environment 1  Impulsiveness 2  Lack of understanding of one's physical/cognitive limitations 4  Total Score 20  Patient Fall Risk Level High fall risk  Adult Fall Risk Interventions  Required Bundle Interventions *See Row Information* High fall risk - low, moderate, and high requirements implemented  Additional Interventions Use of appropriate toileting equipment (bedpan, BSC, etc.)  Screening for Fall Injury Risk (To be completed on HIGH fall risk patients) - Assessing Need for Floor Mats  Risk For Fall Injury- Criteria for Floor Mats Confusion/dementia (+NuDESC, CIWA, TBI, etc.)  Will Implement Floor Mats Yes  Vitals  Temp 98.3 F (36.8 C)  Temp Source Oral  BP (!) 144/107  MAP (mmHg) 117  BP Location Left Arm  BP Method Automatic  Patient Position (if appropriate) Lying  Pulse Rate (!) 107  Pulse Rate Source Monitor  Resp 20  Oxygen Therapy  SpO2 100 %  O2 Device Room Air  Pain Assessment  Pain Scale  Faces  Faces Pain Scale 0  PCA/Epidural/Spinal Assessment  Respiratory Pattern Regular;Unlabored  Neurological  Neuro (WDL) X  Level of Consciousness Responds to Voice  Orientation Level Disoriented to place;Disoriented to time;Disoriented to situation  Cognition Poor judgement;Poor safety awareness  Speech Incomprehensible  Pupil Assessment  No  Neuro Symptoms Fatigue  Glasgow Coma Scale  Eye Opening 3  Best Verbal Response (NON-intubated) 2  Best Motor Response 6  Glasgow Coma Scale Score 11  Musculoskeletal  Musculoskeletal (WDL) X  Assistive Device BSC  Generalized Weakness Yes  Weight Bearing Restrictions No  Integumentary  Integumentary (WDL) X  Skin Color Appropriate for ethnicity  Skin Condition Dry  Skin Integrity Cracking  Cracking Location Foot  Cracking Location Orientation Bilateral  Cracking Intervention Other (Comment)  Skin Turgor Non-tenting    Bed alarm did not sound. Heard noise from patient room. Immediately checked on patient. Helped patient back to bed, checked vital signs and pain. There were no signs of injury. Made MD aware of situation. MD gave an order for waist belt and soft wrist restraints. Mother of patient, Brandi Romero was notified of situation and the application of restraints. Post fall huddle with charge nurse complete.

## 2021-08-10 NOTE — Progress Notes (Addendum)
Spoke with Malachi Bonds (pt's mother) Informed Ativan was given due to withdrawal symptoms. - mother believes that ativan makes her daughter more agitated, believes pt not on alcohol withdrawal anymore because she was doing fine all day.  Wants ativan should be taken off from med list. Explained the reason why ativan was needed. Informed pt will be maintained on restraints all night for safety.  [id number]: Pt refuses blood pressure taking.

## 2021-08-10 NOTE — Progress Notes (Signed)
PROGRESS NOTE    Brandi Romero  [id number] DOB: 1976/09/23 DOA: 08/06/2021 PCP: Pcp, No   Chief Complaint  Patient presents with   Generalized Body Aches    Brief Narrative:  Patient 45 year old female history of asthma, seizure disorder, alcohol use, marijuana use, PUD, GI bleed, anemia, colonic polyps presenting to the ED with one 1 week history of flulike symptoms with body aches, cough, shortness of breath, chest pain, nausea vomiting generalized abdominal pain.  In the ED patient noted to have a CBG of 56.  CT angiogram chest done negative for PE.  CT abdomen and pelvis concerning for pancolitis, hepatomegaly, poorly defined uncinate process of the pancreas, nonspecific vague fat stranding along with bowel mesentery.  Patient admitted, placed empirically on IV antibiotics, PPI, supportive care.  Patient noted to be agitated confused likely in alcohol withdrawal and placed on Ativan withdrawal protocol.  Librium detox protocol added to regiment.  Patient also noted to have QTC prolongation electrolyte abnormalities.  Due to worsening transaminitis and pancolitis GI consulted for further evaluation and management.   Assessment & Plan:   Principal Problem:   Pancolitis ([place]) Active Problems:   Alcohol withdrawal (HCC)   Hypokalemia   Hypomagnesemia   QT prolongation   Hypoglycemia   Alcohol abuse   Transaminitis   Acute metabolic encephalopathy  1 pancolitis -Patient presented with generalized abdominal pain, diarrhea, intractable nausea and vomiting noted to have a significant lactic acidosis also noted to be significantly dehydrated. -No leukocytosis noted. -UDS done positive for THC which was found may be contributing to nausea and vomiting. -Patient also noted to have an elevated alcohol level on admission. -CT abdomen and pelvis done concerning for pancolitis. -C. difficile PCR done with a positive C. difficile antigen, negative toxin, PCR negative. -Patient however  presented with a clinical colitis with CT evidence of pancolitis and as such patient started on oral vancomycin. -Continue IV Flagyl, oral vancomycin.. -Patient seen in consultation by GI who are recommending discontinuation of IV Rocephin which has been done. -Saline lock IV fluids.   -Diet advanced to a soft diet this morning which patient is tolerating per mother.   -Supportive care.    2.  Transaminitis -Likely secondary to alcohol use. -Patient with history of alcohol use and currently in alcohol withdrawal. -CT abdomen and pelvis done with hepatomegaly, poorly defined uncinate process of the pancreas, no gallstones, no gallbladder wall thickening, no pericholecystic fluid, no biliary dilatation. -Acute hepatitis panel negative. -LFTs trending down. -HIV negative. -INR of 1.4. -GI consulted and feel transaminitis likely secondary to alcoholic hepatitis. -Supportive care.  3.  QTC prolongation -Replete electrolytes, keep magnesium around 2, potassium around 4. -Repeat EKG still with QT prolongation.   -Repeat EKG tomorrow.    4.  Hypokalemia/hypomagnesemia/hypophosphatemia -Repleted.  Potassium at 4.9, phosphorus at 2.8, magnesium at 1.7. -Magnesium sulfate 4 g IV x1..  5.  Alcohol withdrawal syndrome -Patient noted to be agitated, some confusion, likely in alcohol withdrawal. -Alcohol level on admission was 247. -Patient was on the Ativan withdrawal protocol. -Librium detox protocol added to current regimen.   -Increase thiamine to 500 mg IV 3 times daily x5 days, then 250 mg IV 3 times daily x5 days, then thiamine 100 mg daily. -Continue multivitamin, folic acid. -Saline lock IV fluids. -TOC consulted.  6.  Microcytic anemia -Likely dilutional and also secondary to folate deficiency and iron deficiency in menstruating female. -Patient with no overt bleeding. -Anemia panel consistent with folate deficiency. -Folic acid daily. -Transfusion  threshold hemoglobin < 7.   -Follow H&H.  7.  Hypoglycemia -Likely secondary to poor oral intake. -CBG 87 this morning.   -Saline lock IV fluids.   -Follow.  8.  Seizure disorder -Patient noted to be on Keppra prior to admission. -No seizures noted. -Patient received a dose of IV Keppra in the ED. -Continue Keppra 500 mg IV every 12 hours until able to tolerate oral intake adequately and could transition back to home regimen of oral Keppra.   9.  Asthma -Stable.  10.  Peptic ulcer disease -Place on Pepcid.  11.  Acute metabolic encephalopathy -Likely secondary to alcohol withdrawal. -Alcohol level within normal limits. -Thiamine level pending.   -If no significant improvement will get a head CT tomorrow. -Change thiamine to 500 mg IV 3 times daily x5 days, then thiamine 250 mg IV 3 times daily x5 days, then thiamine 100 mg daily.  -   DVT prophylaxis: SCDs Code Status: Full Family Communication: Updated mother, Peter Congo at bedside. Disposition:   Status is: Inpatient  Remains inpatient appropriate because: Severity of illness       Consultants:  GI: Dr. Therisa Doyne 08/08/2021  Procedures:  CT angiogram chest 08/06/2021 CT abdomen and pelvis 08/06/2021 Chest x-ray 08/06/2021   Antimicrobials: Oral vancomycin 08/08/2021>>>>> IV Rocephin 08/07/2021>>>>> 08/08/2021 IV Flagyl 08/07/2021>>>>>   Subjective: Patient drowsy, mother at bedside stating patient just received some medication for sedation.  Per mother patient tolerated soft diet this morning.  Patient still with some confusion.  Per RN/nurse tech no watery bowel movements this morning.  Mother concerned about patient's alcohol abuse and hoping patient could go to rehab for alcohol abuse.  Objective: Vitals:   08/09/21 1104 08/09/21 1955 08/10/21 0330 08/10/21 0434  BP: (!) 154/118 (!) 159/110  (!) 156/114  Pulse: (!) 103 (!) 110 (!) 109 77  Resp: 20 20  20   Temp: 97.9 F (36.6 C) 97.8 F (36.6 C)  97.9 F (36.6 C)  TempSrc: Oral  Oral  Oral  SpO2: 96% 94%  100%  Weight:      Height:        Intake/Output Summary (Last 24 hours) at 08/10/2021 1140 Last data filed at 08/10/2021 0900 Gross per 24 hour  Intake 3337.41 ml  Output 850 ml  Net 2487.41 ml    Filed Weights   08/06/21 1415  Weight: 59 kg    Examination:  General exam: Drowsy. Respiratory system: Lungs/auscultation bilaterally anterior lung fields.  No wheezes, no crackles, no rhonchi.  Normal respiratory effort.. Cardiovascular system: RRR no murmurs rubs or gallops.  No JVD.  No lower extremity edema.  Gastrointestinal system: Abdomen is soft, nontender, nondistended, decreased tenderness to palpation diffusely, positive bowel sounds.  No rebound.  No guarding. Central nervous system: Drowsy.  Moving extremities spontaneously.. No focal neurological deficits. Extremities: Symmetric 5 x 5 power. Skin: No rashes, lesions or ulcers Psychiatry: Judgement and insight appear poor.. Mood & affect unable to assess.     Data Reviewed: I have personally reviewed following labs and imaging studies  CBC: Recent Labs  Lab 08/07/21 0336 08/07/21 2139 08/08/21 0415 08/09/21 0334 08/10/21 0333  WBC 5.4 4.0 5.9 5.1 6.7  NEUTROABS 3.4 2.4 4.8 3.3 4.4  HGB 9.2* 8.2* 8.7* 8.5* 8.5*  HCT 30.5* 26.4* 27.9* 27.7* 27.3*  MCV 75.3* 73.7* 74.0* 74.5* 73.6*  PLT 195 162 148* 151 168     Basic Metabolic Panel: Recent Labs  Lab 08/06/21 1550 08/06/21 2031 08/06/21 2124 08/07/21 0334 08/07/21  4259 08/07/21 2139 08/08/21 0415 08/09/21 0334 08/10/21 0333  NA 140 138 141  --  135  --  139 136 137  K 2.5* 2.3* 2.2*  --  2.9* 3.5 3.8 4.8 4.9  CL 96* 98  --   --  98  --  106 106 109  CO2 26 23  --   --  24  --  24 24 24   GLUCOSE 79 92  --   --  102*  --  108* 97 98  BUN 6 5*  --   --  5*  --  <5* <5* <5*  CREATININE 0.62 0.64  --   --  0.55  --  0.56 0.43* 0.41*  CALCIUM 7.9* 7.3*  --   --  7.2*  --  7.0* 7.7* 8.0*  MG 1.2*  --   --  1.5*  --   --   1.7 1.6* 1.7  PHOS  --   --   --   --   --   --  1.6* 1.9* 2.8     GFR: Estimated Creatinine Clearance: 81.6 mL/min (A) (by C-G formula based on SCr of 0.41 mg/dL (L)).  Liver Function Tests: Recent Labs  Lab 08/06/21 1550 08/07/21 0336 08/08/21 0415 08/09/21 0334 08/10/21 0333  AST 70* 51* 232* 90* 38  ALT 16 14 21 20 15   ALKPHOS 149* 127* 122 136* 126  BILITOT 1.1 1.1 1.5* 1.5* 1.4*  PROT 7.0 5.9* 5.5* 6.2* 6.5  ALBUMIN 2.7* 2.3* 2.1* 2.4* 2.6*     CBG: Recent Labs  Lab 08/09/21 1956 08/09/21 2334 08/10/21 0428 08/10/21 0721 08/10/21 1058  GLUCAP 74 90 87 95 95      Recent Results (from the past 240 hour(s))  Resp Panel by RT-PCR (Flu A&B, Covid) Nasopharyngeal Swab     Status: None   Collection Time: 08/06/21  2:15 PM   Specimen: Nasopharyngeal Swab; Nasopharyngeal(NP) swabs in vial transport medium  Result Value Ref Range Status   SARS Coronavirus 2 by RT PCR NEGATIVE NEGATIVE Final    Comment: (NOTE) SARS-CoV-2 target nucleic acids are NOT DETECTED.  The SARS-CoV-2 RNA is generally detectable in upper respiratory specimens during the acute phase of infection. The lowest concentration of SARS-CoV-2 viral copies this assay can detect is 138 copies/mL. A negative result does not preclude SARS-Cov-2 infection and should not be used as the sole basis for treatment or other patient management decisions. A negative result may occur with  improper specimen collection/handling, submission of specimen other than nasopharyngeal swab, presence of viral mutation(s) within the areas targeted by this assay, and inadequate number of viral copies(<138 copies/mL). A negative result must be combined with clinical observations, patient history, and epidemiological information. The expected result is Negative.  Fact Sheet for Patients:  08/12/21  Fact Sheet for Healthcare Providers:  13/09/22  This  test is no t yet approved or cleared by the [employer] FDA and  has been authorized for detection and/or diagnosis of SARS-CoV-2 by FDA under an Emergency Use Authorization (EUA). This EUA will remain  in effect (meaning this test can be used) for the duration of the COVID-19 declaration under Section 564(b)(1) of the Act, 21 U.S.C.section 360bbb-3(b)(1), unless the authorization is terminated  or revoked sooner.       Influenza A by PCR NEGATIVE NEGATIVE Final   Influenza B by PCR NEGATIVE NEGATIVE Final    Comment: (NOTE) The Xpert Xpress SARS-CoV-2/FLU/RSV plus assay is intended as an aid  in the diagnosis of influenza from Nasopharyngeal swab specimens and should not be used as a sole basis for treatment. Nasal washings and aspirates are unacceptable for Xpert Xpress SARS-CoV-2/FLU/RSV testing.  Fact Sheet for Patients: EntrepreneurPulse.com.au  Fact Sheet for Healthcare Providers: IncredibleEmployment.be  This test is not yet approved or cleared by the [place] FDA and has been authorized for detection and/or diagnosis of SARS-CoV-2 by FDA under an Emergency Use Authorization (EUA). This EUA will remain in effect (meaning this test can be used) for the duration of the COVID-19 declaration under Section 564(b)(1) of the Act, 21 U.S.C. section 360bbb-3(b)(1), unless the authorization is terminated or revoked.  Performed at [facility], [address] [place], [place] [address]   Blood culture (routine x 2)     Status: Abnormal   Collection Time: 08/06/21  7:35 PM   Specimen: BLOOD  Result Value Ref Range Status   Specimen Description   Final    BLOOD BLOOD RIGHT ARM Performed at [facility], [address] [place], [place] [address]    Special Requests   Final    BOTTLES DRAWN AEROBIC AND ANAEROBIC Blood Culture adequate volume Performed at [facility], [address] [place], [place]  [address]    Culture  Setup Time   Final    GRAM POSITIVE COCCI IN BOTH AEROBIC AND ANAEROBIC BOTTLES CRITICAL RESULT CALLED TO, READ BACK BY AND VERIFIED WITH: PHARMD MICHELLE LILLISTON  08/08/2021 @0026  BY JW    Culture (A)  Final    STAPHYLOCOCCUS EPIDERMIDIS THE SIGNIFICANCE OF ISOLATING THIS ORGANISM FROM A SINGLE VENIPUNCTURE CANNOT BE PREDICTED WITHOUT FURTHER CLINICAL AND CULTURE CORRELATION. SUSCEPTIBILITIES AVAILABLE ONLY ON REQUEST. Performed at [facility], [place] [address] [place], [place] [address]    Report Status 08/09/2021 FINAL  Final  Blood Culture ID Panel (Reflexed)     Status: Abnormal   Collection Time: 08/06/21  7:35 PM  Result Value Ref Range Status   Enterococcus faecalis NOT DETECTED NOT DETECTED Final   Enterococcus Faecium NOT DETECTED NOT DETECTED Final   Listeria monocytogenes NOT DETECTED NOT DETECTED Final   Staphylococcus species DETECTED (A) NOT DETECTED Final    Comment: CRITICAL RESULT CALLED TO, READ BACK BY AND VERIFIED WITH: PHARMD MICHELLE LILLISTON  08/08/2021 @0026  BY JW    Staphylococcus aureus (BCID) NOT DETECTED NOT DETECTED Final   Staphylococcus epidermidis DETECTED (A) NOT DETECTED Final    Comment: CRITICAL RESULT CALLED TO, READ BACK BY AND VERIFIED WITH: PHARMD MICHELLE LILLISTON  08/08/2021 @0026  BY JW    Staphylococcus lugdunensis NOT DETECTED NOT DETECTED Final   Streptococcus species NOT DETECTED NOT DETECTED Final   Streptococcus agalactiae NOT DETECTED NOT DETECTED Final   Streptococcus pneumoniae NOT DETECTED NOT DETECTED Final   Streptococcus pyogenes NOT DETECTED NOT DETECTED Final   A.calcoaceticus-baumannii NOT DETECTED NOT DETECTED Final   Bacteroides fragilis NOT DETECTED NOT DETECTED Final   Enterobacterales NOT DETECTED NOT DETECTED Final   Enterobacter cloacae complex NOT DETECTED NOT DETECTED Final   Escherichia coli NOT DETECTED NOT DETECTED Final   Klebsiella aerogenes NOT DETECTED NOT DETECTED Final    Klebsiella oxytoca NOT DETECTED NOT DETECTED Final   Klebsiella pneumoniae NOT DETECTED NOT DETECTED Final   Proteus species NOT DETECTED NOT DETECTED Final   Salmonella species NOT DETECTED NOT DETECTED Final   Serratia marcescens NOT DETECTED NOT DETECTED Final   Haemophilus influenzae NOT DETECTED NOT DETECTED Final  Neisseria meningitidis NOT DETECTED NOT DETECTED Final   Pseudomonas aeruginosa NOT DETECTED NOT DETECTED Final   Stenotrophomonas maltophilia NOT DETECTED NOT DETECTED Final   Candida albicans NOT DETECTED NOT DETECTED Final   Candida auris NOT DETECTED NOT DETECTED Final   Candida glabrata NOT DETECTED NOT DETECTED Final   Candida krusei NOT DETECTED NOT DETECTED Final   Candida parapsilosis NOT DETECTED NOT DETECTED Final   Candida tropicalis NOT DETECTED NOT DETECTED Final   Cryptococcus neoformans/gattii NOT DETECTED NOT DETECTED Final   Methicillin resistance mecA/C NOT DETECTED NOT DETECTED Final    Comment: Performed at [facility], [place] [address], [place], [place] [address]  Blood culture (routine x 2)     Status: None (Preliminary result)   Collection Time: 08/06/21 11:17 PM   Specimen: BLOOD  Result Value Ref Range Status   Specimen Description   Final    BLOOD BLOOD LEFT FOREARM Performed at [facility] [address], [place], [place] [address]    Special Requests   Final    BOTTLES DRAWN AEROBIC ONLY Blood Culture adequate volume Performed at [facility] [address], [place], [place] [address]    Culture   Final    NO GROWTH 3 DAYS Performed at [facility], [place] [address], [place], [place] [address]    Report Status PENDING  Incomplete  Gastrointestinal Panel by PCR , Stool     Status: None   Collection Time: 08/08/21  5:23 AM   Specimen: STOOL  Result Value Ref Range Status   Campylobacter species NOT DETECTED NOT DETECTED Final   Plesimonas shigelloides NOT DETECTED NOT DETECTED Final    Salmonella species NOT DETECTED NOT DETECTED Final   Yersinia enterocolitica NOT DETECTED NOT DETECTED Final   Vibrio species NOT DETECTED NOT DETECTED Final   Vibrio cholerae NOT DETECTED NOT DETECTED Final   Enteroaggregative E coli (EAEC) NOT DETECTED NOT DETECTED Final   Enteropathogenic E coli (EPEC) NOT DETECTED NOT DETECTED Final   Enterotoxigenic E coli (ETEC) NOT DETECTED NOT DETECTED Final   Shiga like toxin producing E coli (STEC) NOT DETECTED NOT DETECTED Final   Shigella/Enteroinvasive E coli (EIEC) NOT DETECTED NOT DETECTED Final   Cryptosporidium NOT DETECTED NOT DETECTED Final   Cyclospora cayetanensis NOT DETECTED NOT DETECTED Final   Entamoeba histolytica NOT DETECTED NOT DETECTED Final   Giardia lamblia NOT DETECTED NOT DETECTED Final   Adenovirus F40/41 NOT DETECTED NOT DETECTED Final   Astrovirus NOT DETECTED NOT DETECTED Final   Norovirus GI/GII NOT DETECTED NOT DETECTED Final   Rotavirus A NOT DETECTED NOT DETECTED Final   Sapovirus (I, II, IV, and V) NOT DETECTED NOT DETECTED Final    Comment: Performed at [facility], [address] [place], [place] [address]  C Difficile Quick Screen w PCR reflex     Status: Abnormal   Collection Time: 08/08/21  5:23 AM   Specimen: STOOL  Result Value Ref Range Status   C Diff antigen POSITIVE (A) NEGATIVE Final   C Diff toxin NEGATIVE NEGATIVE Final   C Diff interpretation Results are indeterminate. See PCR results.  Final    Comment: Performed at [facility], [place] [address], [place],  [address]  C. Diff by PCR, Reflexed     Status: None   Collection Time: 08/08/21  5:23 AM  Result Value Ref Range Status   Toxigenic C. Difficile by PCR NEGATIVE NEGATIVE Final  Comment: Patient is colonized with non toxigenic C. difficile. May not need treatment unless significant symptoms are present. Performed at [facility], [place] [address], [place], [place] [address]            Radiology Studies: No results found.      Scheduled Meds:  chlordiazePOXIDE  25 mg Oral [id number]   Followed by   Derrill Memo ON 08/11/2021] chlordiazePOXIDE  25 mg Oral Daily   chlorhexidine  15 mL Mouth Rinse BID   Chlorhexidine Gluconate Cloth  6 each Topical Daily   folic acid  1 mg Intravenous Daily   mouth rinse  15 mL Mouth Rinse q12n4p   multivitamin with minerals  1 tablet Oral Daily   sodium chloride flush  10-40 mL Intracatheter Q12H   sucralfate  1 g Oral TID WC & HS   [START ON 08/20/2021] thiamine  100 mg Oral Daily   Or   [START ON 08/20/2021] thiamine  100 mg Intravenous Daily   vancomycin  125 mg Oral QID   Continuous Infusions:  levETIRAcetam 500 mg (08/10/21 0523)   magnesium sulfate bolus IVPB 4 g (08/10/21 0942)   metronidazole 500 mg (08/10/21 0940)   [START ON 08/15/2021] thiamine injection     thiamine injection 500 mg (08/10/21 1055)     LOS: 3 days    Time spent: 40 minutes    Irine Seal, MD [facility]   To contact the attending provider between 7A-7P or the covering provider during after hours 7P-7A, please log into the web site [email or web address] and access using universal [place] password for that web site. If you do not have the password, please call the hospital operator.  08/10/2021, 11:40 AM

## 2021-08-10 NOTE — Progress Notes (Deleted)
   08/10/21 1813  What Happened  Was fall witnessed? No  Was patient injured? No  Patient found on floor (on Floor mat)  Found by Staff-comment (RN)  Stated prior activity other (comment) (in bed)  Follow Up  MD notified Ramiro Harvest MD  Time MD notified [phone]  Family notified Yes - comment  Time family notified 1827  Additional tests No  Simple treatment Other (comment) (Assess Vital signs and pain)  Adult Fall Risk Assessment  Risk Factor Category (scoring not indicated) High fall risk per protocol (document High fall risk)  Age 45  Fall History: Fall within 6 months prior to admission 0  Elimination; Bowel and/or Urine Incontinence 2  Elimination; Bowel and/or Urine Urgency/Frequency 0  Medications: includes PCA/Opiates, Anti-convulsants, Anti-hypertensives, Diuretics, Hypnotics, Laxatives, Sedatives, and Psychotropics 3  Patient Care Equipment 2  Mobility-Assistance 2  Mobility-Gait 2  Mobility-Sensory Deficit 2  Altered awareness of immediate physical environment 1  Impulsiveness 0  Lack of understanding of one's physical/cognitive limitations 0  Total Score 14  Patient Fall Risk Level High fall risk  Adult Fall Risk Interventions  Required Bundle Interventions *See Row Information* High fall risk - low, moderate, and high requirements implemented  Additional Interventions Use of appropriate toileting equipment (bedpan, BSC, etc.)  Screening for Fall Injury Risk (To be completed on HIGH fall risk patients) - Assessing Need for Floor Mats  Risk For Fall Injury- Criteria for Floor Mats Confusion/dementia (+NuDESC, CIWA, TBI, etc.)  Will Implement Floor Mats Yes  Vitals  Temp 98.3 F (36.8 C) (Simultaneous filing. User may not have seen previous data.)  Temp Source Oral (Simultaneous filing. User may not have seen previous data.)  BP (!) 144/107  MAP (mmHg) 117  BP Location Left Arm  BP Method Automatic  Patient Position (if appropriate) Lying  Pulse Rate (!) 107   Pulse Rate Source Monitor  Resp 20  Oxygen Therapy  SpO2 100 %  O2 Device Room Air  Pain Assessment  Pain Scale Faces  Faces Pain Scale 0  PCA/Epidural/Spinal Assessment  Respiratory Pattern Regular;Unlabored  Neurological  Neuro (WDL) X  Level of Consciousness Responds to Voice  Orientation Level Disoriented to place;Disoriented to time;Disoriented to situation  Cognition Poor judgement;Poor safety awareness  Speech Incomprehensible  Pupil Assessment  No  Neuro Symptoms Fatigue  Glasgow Coma Scale  Eye Opening 3  Best Verbal Response (NON-intubated) 2  Best Motor Response 6  Glasgow Coma Scale Score 11  Musculoskeletal  Musculoskeletal (WDL) X  Assistive Device BSC  Generalized Weakness Yes  Weight Bearing Restrictions No  Integumentary  Integumentary (WDL) X  Skin Color Appropriate for ethnicity  Skin Condition Dry  Skin Integrity Cracking  Cracking Location Foot  Cracking Location Orientation Bilateral  Cracking Intervention Other (Comment)  Skin Turgor Non-tenting

## 2021-08-10 NOTE — Evaluation (Signed)
Physical Therapy Evaluation Patient Details Name: Brandi Romero MRN: [id number] DOB: Feb 22, 1976 Today's Date: 08/10/2021  History of Present Illness  45 year old female history of asthma, seizure disorder, alcohol use, marijuana use, PUD, GI bleed, anemia, colonic polyps presenting to the ED with one 1 week history of flulike symptoms with body aches, cough, shortness of breath, chest pain, nausea vomiting generalized abdominal pain.  Patient noted to be agitated confused likely in alcohol withdrawal and placed on Ativan withdrawal protocol.  Librium detox protocol added to regiment.  Patient also noted to have QTC prolongation electrolyte abnormalities.  Due to worsening transaminitis and pancolitis GI consulted for further evaluation and management.  Clinical Impression  Pt admitted with above diagnosis. Pt currently with functional limitations due to the deficits listed below (see PT Problem List). Pt will benefit from skilled PT to increase their independence and safety with mobility to allow discharge to the venue listed below.  Pt with decreased cognition and requiring assist for mobility and balance today.  Pt's mother present and assisted with providing PLOF, pt lives with her boyfriend and his mother.  Pt requiring at least mod assist today to stand and unable to march in place.  Pt also presents with balance deficits.  Pt mother reports concern if pt is discharged tomorrow.  Recommend SNF if possible upon d/c however pt's mobility will likely improve as cognition improves so will continue to update d/c recommendations as pt progresses.        Recommendations for follow up therapy are one component of a multi-disciplinary discharge planning process, led by the attending physician.  Recommendations may be updated based on patient status, additional functional criteria and insurance authorization.  Follow Up Recommendations Skilled nursing-short term rehab (<3 hours/day)    Assistance  Recommended at Discharge Frequent or constant Supervision/Assistance  Functional Status Assessment Patient has had a recent decline in their functional status and demonstrates the ability to make significant improvements in function in a reasonable and predictable amount of time.  Equipment Recommendations  Rolling walker (2 wheels)    Recommendations for Other Services       Precautions / Restrictions Precautions Precautions: Fall Restrictions Weight Bearing Restrictions: No      Mobility  Bed Mobility Overal bed mobility: Needs Assistance Bed Mobility: Supine to Sit;Sit to Supine     Supine to sit: Min assist Sit to supine: Mod assist   General bed mobility comments: assist for trunk upright, assist for LEs onto bed    Transfers Overall transfer level: Needs assistance Equipment used: Rolling walker (2 wheels) Transfers: Sit to/from Stand Sit to Stand: Mod assist           General transfer comment: cues for safety and positioning, assist to rise and steady, posterior lean present upon standing, pt able shuffle feet in place however poorly coordinated and not lifting feet like marching; assisted pt with side "steps" up HOB and pt with difficulty coordinating LEs as well as pushing down on RW despite multimodal cues    Ambulation/Gait               General Gait Details: deferred for safety  Stairs            Wheelchair Mobility    Modified Rankin (Stroke Patients Only)       Balance Overall balance assessment: Needs assistance         Standing balance support: Bilateral upper extremity supported;Reliant on assistive device for balance Standing balance-Leahy Scale: Zero  Pertinent Vitals/Pain Pain Assessment: Faces Faces Pain Scale: No hurt Pain Intervention(s): Repositioned;Monitored during session    Home Living Family/patient expects to be discharged to:: Private residence Living Arrangements:  Spouse/significant other   Type of Home: House         Home Layout: Two level Home Equipment: None Additional Comments: per pt's mother, pt lives with her boyfriend and his mom, uncertain of environment but pt does report stairs    Prior Function Prior Level of Function : Independent/Modified Independent                     Hand Dominance        Extremity/Trunk Assessment        Lower Extremity Assessment Lower Extremity Assessment: Generalized weakness    Cervical / Trunk Assessment Cervical / Trunk Assessment: Normal  Communication   Communication: Expressive difficulties (low, poorly pronounced speech even with cues to correct)  Cognition Arousal/Alertness: Awake/alert Behavior During Therapy: Flat affect;Impulsive Overall Cognitive Status: Impaired/Different from baseline Area of Impairment: Following commands;Safety/judgement                       Following Commands: Follows one step commands inconsistently Safety/Judgement: Decreased awareness of deficits;Decreased awareness of safety     General Comments: pt only orientated to self, inconsistent with following commands        General Comments      Exercises     Assessment/Plan    PT Assessment Patient needs continued PT services  PT Problem List Decreased strength;Decreased coordination;Decreased activity tolerance;Decreased balance;Decreased mobility;Decreased knowledge of precautions;Decreased safety awareness;Decreased knowledge of use of DME;Decreased cognition       PT Treatment Interventions DME instruction;Gait training;Balance training;Therapeutic exercise;Functional mobility training;Therapeutic activities;Patient/family education;Stair training    PT Goals (Current goals can be found in the Care Plan section)  Acute Rehab PT Goals PT Goal Formulation: With patient/family Time For Goal Achievement: 08/24/21 Potential to Achieve Goals: Good    Frequency Min 3X/week    Barriers to discharge        Co-evaluation               AM-PAC PT "6 Clicks" Mobility  Outcome Measure Help needed turning from your back to your side while in a flat bed without using bedrails?: A Lot Help needed moving from lying on your back to sitting on the side of a flat bed without using bedrails?: A Lot Help needed moving to and from a bed to a chair (including a wheelchair)?: A Lot Help needed standing up from a chair using your arms (e.g., wheelchair or bedside chair)?: A Lot Help needed to walk in hospital room?: Total Help needed climbing 3-5 steps with a railing? : Total 6 Click Score: 10    End of Session Equipment Utilized During Treatment: Gait belt Activity Tolerance: Patient tolerated treatment well Patient left: in bed;with call bell/phone within reach;with bed alarm set;with family/visitor present Nurse Communication: Mobility status PT Visit Diagnosis: Difficulty in walking, not elsewhere classified (R26.2);Unsteadiness on feet (R26.81)    Time: 4431-5400 PT Time Calculation (min) (ACUTE ONLY): 17 min   Charges:   PT Evaluation $PT Eval Moderate Complexity: 1 Mod        Kati PT, DPT Acute Rehabilitation Services Pager: [phone] Office: [phone]   [name] 08/10/2021, 12:51 PM

## 2021-08-10 NOTE — Progress Notes (Signed)
Patient asking to go outside to smoke and drink, re-oriented patient, got her order for nicotine patch.

## 2021-08-10 NOTE — Progress Notes (Addendum)
Pt with CIWA of 13-agitated and restless, impulsive-wants to get out of bed, refuses wrist restraint -Able to take pills, but started to refuse the Thiamine infusions.  -Removed wrist restraint for now, maintained on posey belt. Provider made aware, CIWA protocol as ordered.   -Still impulsive, placed back wrist restraints,refusing PO ativan.

## 2021-08-10 NOTE — Progress Notes (Signed)
Patient doing about the same seen by me today agree with Dr. Carollee Massed plans who was discussing the case with the family member liver tests improved CBC stable decrease stools reported please call me this weekend if I could be of any further assistance otherwise we will ask rounding team to check on early next week

## 2021-08-11 LAB — CBC WITH DIFFERENTIAL/PLATELET
Abs Immature Granulocytes: 0.03 10*3/uL (ref 0.00–0.07)
Basophils Absolute: 0 10*3/uL (ref 0.0–0.1)
Basophils Relative: 0 %
Eosinophils Absolute: 0.1 10*3/uL (ref 0.0–0.5)
Eosinophils Relative: 1 %
HCT: 25.5 % — ABNORMAL LOW (ref 36.0–46.0)
Hemoglobin: 7.9 g/dL — ABNORMAL LOW (ref 12.0–15.0)
Immature Granulocytes: 0 %
Lymphocytes Relative: 17 %
Lymphs Abs: 1.2 10*3/uL (ref 0.7–4.0)
MCH: 23.1 pg — ABNORMAL LOW (ref 26.0–34.0)
MCHC: 31 g/dL (ref 30.0–36.0)
MCV: 74.6 fL — ABNORMAL LOW (ref 80.0–100.0)
Monocytes Absolute: 1.3 10*3/uL — ABNORMAL HIGH (ref 0.1–1.0)
Monocytes Relative: 17 %
Neutro Abs: 4.6 10*3/uL (ref 1.7–7.7)
Neutrophils Relative %: 65 %
Platelets: 152 10*3/uL (ref 150–400)
RBC: 3.42 MIL/uL — ABNORMAL LOW (ref 3.87–5.11)
RDW: 22.4 % — ABNORMAL HIGH (ref 11.5–15.5)
WBC: 7.2 10*3/uL (ref 4.0–10.5)
nRBC: 0 % (ref 0.0–0.2)

## 2021-08-11 LAB — COMPREHENSIVE METABOLIC PANEL
ALT: 13 U/L (ref 0–44)
AST: 29 U/L (ref 15–41)
Albumin: 2.4 g/dL — ABNORMAL LOW (ref 3.5–5.0)
Alkaline Phosphatase: 116 U/L (ref 38–126)
Anion gap: 5 (ref 5–15)
BUN: <5 mg/dL — ABNORMAL LOW (ref 6–20)
CO2: 24 mmol/L (ref 22–32)
Calcium: 8.2 mg/dL — ABNORMAL LOW (ref 8.9–10.3)
Chloride: 109 mmol/L (ref 98–111)
Creatinine, Ser: 0.56 mg/dL (ref 0.44–1.00)
GFR, Estimated: >60 mL/min (ref >60)
Glucose, Bld: 82 mg/dL (ref 70–99)
Potassium: 4.3 mmol/L (ref 3.5–5.1)
Sodium: 138 mmol/L (ref 135–145)
Total Bilirubin: 1.3 mg/dL — ABNORMAL HIGH (ref 0.3–1.2)
Total Protein: 6.1 g/dL — ABNORMAL LOW (ref 6.5–8.1)

## 2021-08-11 LAB — GLUCOSE, CAPILLARY
Glucose-Capillary: 111 mg/dL — ABNORMAL HIGH (ref 70–99)
Glucose-Capillary: 66 mg/dL — ABNORMAL LOW (ref 70–99)
Glucose-Capillary: 71 mg/dL (ref 70–99)
Glucose-Capillary: 76 mg/dL (ref 70–99)
Glucose-Capillary: 83 mg/dL (ref 70–99)
Glucose-Capillary: 86 mg/dL (ref 70–99)

## 2021-08-11 LAB — MAGNESIUM: Magnesium: 2 mg/dL (ref 1.7–2.4)

## 2021-08-11 MED ORDER — LIP MEDEX EX OINT
TOPICAL_OINTMENT | CUTANEOUS | Status: AC
  Filled 2021-08-11: qty 7

## 2021-08-11 NOTE — NC FL2 (Signed)
[place] MEDICAID FL2 LEVEL OF CARE SCREENING TOOL     IDENTIFICATION  Patient Name: Brandi Romero Birthdate: 15-Aug-1976 Sex: female Admission Date (Current Location): 08/06/2021  [facility] and [place] Number:  Producer, television/film/video and Address:  [facility],  [address] [place], [place] [address]      Provider Number: [id number]  Attending Physician Name and Address:  Rodolph Bong, MD  Relative Name and Phone Number:       Current Level of Care: Hospital Recommended Level of Care: Skilled Nursing Facility Prior Approval Number:    Date Approved/Denied:   PASRR Number: [id number]  Discharge Plan: SNF    Current Diagnoses: Patient Active Problem List   Diagnosis Date Noted   Acute metabolic encephalopathy 08/09/2021   Transaminitis 08/08/2021   Alcohol withdrawal (HCC) 08/08/2021   Hypokalemia 08/07/2021   Hypomagnesemia 08/07/2021   QT prolongation 08/07/2021   Hypoglycemia 08/07/2021   Alcohol abuse 08/07/2021   Pancolitis (HCC) 08/06/2021    Orientation RESPIRATION BLADDER Height & Weight     Self, Situation, Place  Normal Continent Weight: 57.9 kg Height:  5\' 5"  (165.1 cm)  BEHAVIORAL SYMPTOMS/MOOD NEUROLOGICAL BOWEL NUTRITION STATUS      Continent Diet (regular)  AMBULATORY STATUS COMMUNICATION OF NEEDS Skin   Extensive Assist Verbally Normal                       Personal Care Assistance Level of Assistance  Bathing, Feeding, Dressing Bathing Assistance: Limited assistance Feeding assistance: Limited assistance Dressing Assistance: Limited assistance     Functional Limitations Info  Sight, Hearing, Speech Sight Info: Adequate Hearing Info: Adequate Speech Info: Adequate    SPECIAL CARE FACTORS FREQUENCY  PT (By licensed PT), OT (By licensed OT)     PT Frequency: 5 x weekly OT Frequency: 5 x weekly            Contractures Contractures Info: Not present    Additional Factors Info  Code Status Code Status Info:  full             Current Medications (08/11/2021):  This is the current hospital active medication list Current Facility-Administered Medications  Medication Dose Route Frequency Provider Last Rate Last Admin   albuterol (PROVENTIL) (2.5 MG/3ML) 0.083% nebulizer solution 2.5 mg  2.5 mg Nebulization Q6H PRN 08/13/2021, MD       chlordiazePOXIDE (LIBRIUM) capsule 25 mg  25 mg Oral Daily John Giovanni, MD       chlorhexidine (PERIDEX) 0.12 % solution 15 mL  15 mL Mouth Rinse BID Rodolph Bong, MD   15 mL at 08/10/21 2119   Chlorhexidine Gluconate Cloth 2 % PADS 6 each  6 each Topical Daily 2120, MD   6 each at 08/11/21 0947   famotidine (PEPCID) IVPB 20 mg premix  20 mg Intravenous Q12H 08/13/21, MD 100 mL/hr at 08/11/21 1104 20 mg at 08/11/21 1104   folic acid (FOLVITE) tablet 1 mg  1 mg Oral Daily 08/13/21, MD       ketorolac (TORADOL) 30 MG/ML injection 30 mg  30 mg Intravenous Q6H PRN Eduard Clos, MD   30 mg at 08/07/21 2212   levETIRAcetam (KEPPRA) IVPB 500 mg/100 mL premix  500 mg Intravenous Q12H 2213, MD 400 mL/hr at 08/11/21 0520 500 mg at 08/11/21 0520   lip balm (CARMEX) ointment   Topical PRN 08/13/21, MD  LORazepam (ATIVAN) injection 0.5 mg  0.5 mg Intravenous Q8H PRN Rodolph Bong, MD   0.5 mg at 08/11/21 0917   LORazepam (ATIVAN) tablet 1-4 mg  1-4 mg Oral Q1H PRN Eduard Clos, MD       Or   LORazepam (ATIVAN) injection 1-4 mg  1-4 mg Intravenous Q1H PRN Eduard Clos, MD   2 mg at 08/10/21 2200   LORazepam (ATIVAN) tablet 0-4 mg  0-4 mg Oral Q6H Eduard Clos, MD   2 mg at 08/11/21 1243   Followed by   Melene Muller ON 08/13/2021] LORazepam (ATIVAN) tablet 0-4 mg  0-4 mg Oral Q12H Eduard Clos, MD       MEDLINE mouth rinse  15 mL Mouth Rinse q12n4p Rodolph Bong, MD   15 mL at 08/10/21 1608   metroNIDAZOLE (FLAGYL) IVPB 500 mg  500 mg Intravenous Q12H  John Giovanni, MD 100 mL/hr at 08/11/21 1009 500 mg at 08/11/21 1009   morphine 2 MG/ML injection 1 mg  1 mg Intravenous Q4H PRN John Giovanni, MD   1 mg at 08/07/21 2139   multivitamin with minerals tablet 1 tablet  1 tablet Oral Daily John Giovanni, MD   1 tablet at 08/10/21 0917   nicotine (NICODERM CQ - dosed in mg/24 hours) patch 21 mg  21 mg Transdermal Daily Rodolph Bong, MD   21 mg at 08/11/21 1005   sodium chloride flush (NS) 0.9 % injection 10-40 mL  10-40 mL Intracatheter Q12H Rodolph Bong, MD   40 mL at 08/11/21 0948   sodium chloride flush (NS) 0.9 % injection 10-40 mL  10-40 mL Intracatheter PRN Rodolph Bong, MD   10 mL at 08/08/21 0416   sucralfate (CARAFATE) tablet 1 g  1 g Oral TID WC & HS Quentin Mulling R, PA-C   1 g at 08/10/21 2112   [START ON 08/15/2021] thiamine (B-1) 250 mg in sodium chloride 0.9 % 50 mL IVPB  250 mg Intravenous TID Rodolph Bong, MD       [START ON 08/20/2021] thiamine tablet 100 mg  100 mg Oral Daily Rodolph Bong, MD       Or   Melene Muller ON 08/20/2021] thiamine (B-1) injection 100 mg  100 mg Intravenous Daily Rodolph Bong, MD       thiamine 500mg  in normal saline (40ml) IVPB  500 mg Intravenous TID 45m, MD 100 mL/hr at 08/11/21 1015 500 mg at 08/11/21 1015   vancomycin (VANCOCIN) capsule 125 mg  125 mg Oral QID 08/13/21, MD   125 mg at 08/10/21 2112     Discharge Medications: Please see discharge summary for a list of discharge medications.  Relevant Imaging Results:  Relevant Lab Results:   Additional Information ssn: [id number]  [id number], RN

## 2021-08-11 NOTE — Progress Notes (Signed)
[facility] Progress Note  Brandi Romero 45 y.o. [birth year]  CC:  Pancoloitis  Subjective: Patient lying in bed comfortably. Patient still having altered mentation States she is walking down the street and asked for a letter for her blunt.  States the year is 2032, date is February 14, and Brandi Romero is president.  Per the nurse: Patient's last bowel movement was at 7pm last night, she had 3 bowel movements yesterday She is eating a little bit, no nausea no vomiting Patient is more alert than previously  ROS :  Review of Systems  Unable to perform ROS: Mental status change   Objective: Vital signs in last 24 hours: Vitals:   08/11/21 0000 08/11/21 0425  BP: (!) 145/100   Pulse: (!) 109 (!) 106  Resp:    Temp:    SpO2:  100%    Physical Exam: General:   alert, more responsive than previously seen on Friday, no acute distress Abdomen:  Soft, Obese AB, skin exam, patient has waist restraint so unable to do skin exam, normal bowel sounds. mild tenderness in the lower abdomen. Without guarding and Without rebound, hepatomegaly noted. Neurologic:  oriented to self only,  grossly normal neurologically Psych:   uncooperative. Agitated mood, trying to get out of bed.      Lab Results: Recent Labs    08/09/21 0334 08/10/21 0333 08/11/21 0313  NA 136 137 138  K 4.8 4.9 4.3  CL 106 109 109  CO2 24 24 24   GLUCOSE 97 98 82  BUN <5* <5* <5*  CREATININE 0.43* 0.41* 0.56  CALCIUM 7.7* 8.0* 8.2*  MG 1.6* 1.7 2.0  PHOS 1.9* 2.8  --    Recent Labs    08/10/21 0333 08/11/21 0313  AST 38 29  ALT 15 13  ALKPHOS 126 116  BILITOT 1.4* 1.3*  PROT 6.5 6.1*  ALBUMIN 2.6* 2.4*   Recent Labs    08/10/21 0333 08/11/21 0313  WBC 6.7 7.2  NEUTROABS 4.4 4.6  HGB 8.5* 7.9*  HCT 27.3* 25.5*  MCV 73.6* 74.6*  PLT 168 152   No results for input(s): LABPROT, INR in the last 72 hours.  Lab Results: Results for orders placed or performed during the hospital encounter of  08/06/21 (from the past 48 hour(s))  Glucose, capillary     Status: None   Collection Time: 08/09/21  4:05 PM  Result Value Ref Range   Glucose-Capillary 87 70 - 99 mg/dL    Comment: Glucose reference range applies only to samples taken after fasting for at least 8 hours.  Glucose, capillary     Status: None   Collection Time: 08/09/21  7:56 PM  Result Value Ref Range   Glucose-Capillary 74 70 - 99 mg/dL    Comment: Glucose reference range applies only to samples taken after fasting for at least 8 hours.  Glucose, capillary     Status: None   Collection Time: 08/09/21 11:34 PM  Result Value Ref Range   Glucose-Capillary 90 70 - 99 mg/dL    Comment: Glucose reference range applies only to samples taken after fasting for at least 8 hours.  Magnesium     Status: None   Collection Time: 08/10/21  3:33 AM  Result Value Ref Range   Magnesium 1.7 1.7 - 2.4 mg/dL    Comment: Performed at [facility], [place] [address] [place],  [address]  CBC with Differential/Platelet     Status: Abnormal   Collection Time: 08/10/21  3:33 AM  Result Value Ref Range   WBC 6.7 4.0 - 10.5 K/uL   RBC 3.71 (L) 3.87 - 5.11 MIL/uL   Hemoglobin 8.5 (L) 12.0 - 15.0 g/dL    Comment: Reticulocyte Hemoglobin testing may be clinically indicated, consider ordering this additional test [id number]    HCT 27.3 (L) 36.0 - 46.0 %   MCV 73.6 (L) 80.0 - 100.0 fL   MCH 22.9 (L) 26.0 - 34.0 pg   MCHC 31.1 30.0 - 36.0 g/dL   RDW 22.5 (H) 11.5 - 15.5 %   Platelets 168 150 - 400 K/uL   nRBC 0.0 0.0 - 0.2 %   Neutrophils Relative % 66 %   Neutro Abs 4.4 1.7 - 7.7 K/uL   Lymphocytes Relative 17 %   Lymphs Abs 1.2 0.7 - 4.0 K/uL   Monocytes Relative 15 %   Monocytes Absolute 1.0 0.1 - 1.0 K/uL   Eosinophils Relative 1 %   Eosinophils Absolute 0.1 0.0 - 0.5 K/uL   Basophils Relative 0 %   Basophils Absolute 0.0 0.0 - 0.1 K/uL   Immature Granulocytes 1 %   Abs Immature Granulocytes 0.04 0.00 - 0.07  K/uL   Target Cells PRESENT     Comment: Performed at [facility], [place] [address], [place], [place] [address]  Comprehensive metabolic panel     Status: Abnormal   Collection Time: 08/10/21  3:33 AM  Result Value Ref Range   Sodium 137 135 - 145 mmol/L   Potassium 4.9 3.5 - 5.1 mmol/L   Chloride 109 98 - 111 mmol/L   CO2 24 22 - 32 mmol/L   Glucose, Bld 98 70 - 99 mg/dL    Comment: Glucose reference range applies only to samples taken after fasting for at least 8 hours.   BUN <5 (L) 6 - 20 mg/dL   Creatinine, Ser 0.41 (L) 0.44 - 1.00 mg/dL   Calcium 8.0 (L) 8.9 - 10.3 mg/dL   Total Protein 6.5 6.5 - 8.1 g/dL   Albumin 2.6 (L) 3.5 - 5.0 g/dL   AST 38 15 - 41 U/L   ALT 15 0 - 44 U/L   Alkaline Phosphatase 126 38 - 126 U/L   Total Bilirubin 1.4 (H) 0.3 - 1.2 mg/dL   GFR, Estimated >60 >60 mL/min    Comment: (NOTE) Calculated using the CKD-EPI Creatinine Equation (2021)    Anion gap 4 (L) 5 - 15    Comment: Performed at [facility], [place] [address], [place], [place] [address]  Phosphorus     Status: None   Collection Time: 08/10/21  3:33 AM  Result Value Ref Range   Phosphorus 2.8 2.5 - 4.6 mg/dL    Comment: Performed at [facility], [place] [address], [place], [place] [address]  Glucose, capillary     Status: None   Collection Time: 08/10/21  4:28 AM  Result Value Ref Range   Glucose-Capillary 87 70 - 99 mg/dL    Comment: Glucose reference range applies only to samples taken after fasting for at least 8 hours.  Glucose, capillary     Status: None   Collection Time: 08/10/21  7:21 AM  Result Value Ref Range   Glucose-Capillary 95 70 - 99 mg/dL    Comment: Glucose reference range applies only to samples taken after fasting for at least 8 hours.  Glucose, capillary     Status: None   Collection Time: 08/10/21 10:58 AM  Result Value  Ref Range   Glucose-Capillary 95 70 - 99 mg/dL    Comment: Glucose reference range applies  only to samples taken after fasting for at least 8 hours.  Glucose, capillary     Status: None   Collection Time: 08/10/21  4:00 PM  Result Value Ref Range   Glucose-Capillary 88 70 - 99 mg/dL    Comment: Glucose reference range applies only to samples taken after fasting for at least 8 hours.  Glucose, capillary     Status: None   Collection Time: 08/10/21  7:55 PM  Result Value Ref Range   Glucose-Capillary 82 70 - 99 mg/dL    Comment: Glucose reference range applies only to samples taken after fasting for at least 8 hours.  Magnesium     Status: None   Collection Time: 08/11/21  3:13 AM  Result Value Ref Range   Magnesium 2.0 1.7 - 2.4 mg/dL    Comment: Performed at [facility], [place] [address], [place], [place] [address]  Comprehensive metabolic panel     Status: Abnormal   Collection Time: 08/11/21  3:13 AM  Result Value Ref Range   Sodium 138 135 - 145 mmol/L   Potassium 4.3 3.5 - 5.1 mmol/L   Chloride 109 98 - 111 mmol/L   CO2 24 22 - 32 mmol/L   Glucose, Bld 82 70 - 99 mg/dL    Comment: Glucose reference range applies only to samples taken after fasting for at least 8 hours.   BUN <5 (L) 6 - 20 mg/dL   Creatinine, Ser 0.56 0.44 - 1.00 mg/dL   Calcium 8.2 (L) 8.9 - 10.3 mg/dL   Total Protein 6.1 (L) 6.5 - 8.1 g/dL   Albumin 2.4 (L) 3.5 - 5.0 g/dL   AST 29 15 - 41 U/L   ALT 13 0 - 44 U/L   Alkaline Phosphatase 116 38 - 126 U/L   Total Bilirubin 1.3 (H) 0.3 - 1.2 mg/dL   GFR, Estimated >60 >60 mL/min    Comment: (NOTE) Calculated using the CKD-EPI Creatinine Equation (2021)    Anion gap 5 5 - 15    Comment: Performed at [facility], [place] [address], [place], [place] [address]  CBC with Differential/Platelet     Status: Abnormal   Collection Time: 08/11/21  3:13 AM  Result Value Ref Range   WBC 7.2 4.0 - 10.5 K/uL   RBC 3.42 (L) 3.87 - 5.11 MIL/uL   Hemoglobin 7.9 (L) 12.0 - 15.0 g/dL    Comment: Reticulocyte Hemoglobin  testing may be clinically indicated, consider ordering this additional test [id number]    HCT 25.5 (L) 36.0 - 46.0 %   MCV 74.6 (L) 80.0 - 100.0 fL   MCH 23.1 (L) 26.0 - 34.0 pg   MCHC 31.0 30.0 - 36.0 g/dL   RDW 22.4 (H) 11.5 - 15.5 %   Platelets 152 150 - 400 K/uL   nRBC 0.0 0.0 - 0.2 %   Neutrophils Relative % 65 %   Neutro Abs 4.6 1.7 - 7.7 K/uL   Lymphocytes Relative 17 %   Lymphs Abs 1.2 0.7 - 4.0 K/uL   Monocytes Relative 17 %   Monocytes Absolute 1.3 (H) 0.1 - 1.0 K/uL   Eosinophils Relative 1 %   Eosinophils Absolute 0.1 0.0 - 0.5 K/uL   Basophils Relative 0 %   Basophils Absolute 0.0 0.0 - 0.1 K/uL   Immature Granulocytes 0 %   Abs Immature Granulocytes 0.03  0.00 - 0.07 K/uL   Target Cells PRESENT     Comment: Performed at [facility], [address], [place], [place] [address]  Glucose, capillary     Status: None   Collection Time: 08/11/21  4:23 AM  Result Value Ref Range   Glucose-Capillary 86 70 - 99 mg/dL    Comment: Glucose reference range applies only to samples taken after fasting for at least 8 hours.  Glucose, capillary     Status: None   Collection Time: 08/11/21  7:37 AM  Result Value Ref Range   Glucose-Capillary 76 70 - 99 mg/dL    Comment: Glucose reference range applies only to samples taken after fasting for at least 8 hours.  Glucose, capillary     Status: Abnormal   Collection Time: 08/11/21 10:35 AM  Result Value Ref Range   Glucose-Capillary 66 (L) 70 - 99 mg/dL    Comment: Glucose reference range applies only to samples taken after fasting for at least 8 hours.    Assessment/Plan: Pancolitis (HCC) Colonoscopy 02/17/2018, marginal prep, polyp right colon s/p snare polypectomy , polyp 15 cm s/p hot biopsy polypectomy , internal hemorrhoids  recall age 39. Patient had positive C. difficile antigen but negative toxin.    On Vanco and Flagyll for probable C. Diff colitis, CBC, CMP and vital all stable.  May continue oral  antibiotic therapy outpatient when able to be discharged.    Alcohol abuse with history of DTs and drug use ETOH level 247, positive opiates and THC.    Elevated LFTS improved  WBC 7.2 HGB 7.9 MCV 74.6 Platelets 152 AST 29(38) ALT 13(15) Alkphos 116(126) TBili 1.3(1.4) GFR >60  INR 1.4  Altered mental status improved Likely alcohol withdrawal with history. Ammonia 33 08/08/2021 Mentation improved, still only oriented to self  At this time pancolitis is improving, liver function improving. [facility] will sign off.  Please contact us if we can be of any further assistance during this hospital stay.     Doree Albee PA-C 08/11/2021, 12:53 PM  Contact #  [phone]

## 2021-08-11 NOTE — TOC Initial Note (Signed)
Transition of Care [facility]) - Initial/Assessment Note    Patient Details  Name: Brandi Romero MRN: [id number] Date of Birth: 15-Apr-1976  Transition of Care [facility]) CM/SW Contact:    Leeroy Cha, RN Phone Number: 08/11/2021, 10:04 AM  Clinical Narrative:                  45 year old female history of asthma, seizure disorder, alcohol use, marijuana use, PUD, GI bleed, anemia, colonic polyps presenting to the ED with one 1 week history of flulike symptoms with body aches, cough, shortness of breath, chest pain, nausea vomiting generalized abdominal pain.  In the ED patient noted to have a CBG of 56.  CT angiogram chest done negative for PE.  CT abdomen and pelvis concerning for pancolitis, hepatomegaly, poorly defined uncinate process of the pancreas, nonspecific vague fat stranding along with bowel mesentery.  Patient admitted, placed empirically on IV antibiotics, PPI, supportive care.  Patient noted to be agitated confused likely in alcohol withdrawal and placed on Ativan withdrawal protocol.  Librium detox protocol added to regiment.  Patient also noted to have QTC prolongation electrolyte abnormalities.  Due to worsening transaminitis and pancolitis GI consulted for further evaluation and management.     Assessment & Plan:   Principal Problem:   Pancolitis ([place]) Active Problems:   Alcohol withdrawal (HCC)   Hypokalemia   Hypomagnesemia   QT prolongation   Hypoglycemia   Alcohol abuse   Transaminitis   Acute metabolic encephalopathy   1 pancolitis -Patient presented with generalized abdominal pain, diarrhea, intractable nausea and vomiting noted to have a significant lactic acidosis also noted to be significantly dehydrated. -No leukocytosis noted. -UDS done positive for THC which was found may be contributing to nausea and vomiting. -Patient also noted to have an elevated alcohol level on admission. -CT abdomen and pelvis done concerning for pancolitis. -C. difficile PCR done  with a positive C. difficile antigen, negative toxin, PCR negative. -Patient however presented with a clinical colitis with CT evidence of pancolitis and as such patient started on oral vancomycin. -Continue IV Flagyl, oral vancomycin.. -Patient seen in consultation by GI who are recommending discontinuation of IV Rocephin which has been done. -Saline lock IV fluids.   -Diet advanced to a soft diet this morning which patient is tolerating per mother.   -Supportive care.    2.  Transaminitis -Likely secondary to alcohol use. -Patient with history of alcohol use and currently in alcohol withdrawal. -CT abdomen and pelvis done with hepatomegaly, poorly defined uncinate process of the pancreas, no gallstones, no gallbladder wall thickening, no pericholecystic fluid, no biliary dilatation. -Acute hepatitis panel negative. -LFTs trending down. -HIV negative. -INR of 1.4. -GI consulted and feel transaminitis likely secondary to alcoholic hepatitis. -Supportive care.  3.  QTC prolongation -Replete electrolytes, keep magnesium around 2, potassium around 4. -Repeat EKG still with QT prolongation.   -Repeat EKG tomorrow.    4.  Hypokalemia/hypomagnesemia/hypophosphatemia -Repleted.  Potassium at 4.9, phosphorus at 2.8, magnesium at 1.7. -Magnesium sulfate 4 g IV x1..  5.  Alcohol withdrawal syndrome -Patient noted to be agitated, some confusion, likely in alcohol withdrawal. -Alcohol level on admission was 247. -Patient was on the Ativan withdrawal protocol. -Librium detox protocol added to current regimen.   -Increase thiamine to 500 mg IV 3 times daily x5 days, then 250 mg IV 3 times daily x5 days, then thiamine 100 mg daily. -Continue multivitamin, folic acid. -Saline lock IV fluids. -TOC consulted.  6.  Microcytic anemia -Likely  dilutional and also secondary to folate deficiency and iron deficiency in menstruating female. -Patient with no overt bleeding. -Anemia panel consistent  with folate deficiency. -Folic acid daily. -Transfusion threshold hemoglobin < 7.  -Follow H&H.  7.  Hypoglycemia -Likely secondary to poor oral intake. -CBG 87 this morning.   -Saline lock IV fluids.   -Follow.  8.  Seizure disorder -Patient noted to be on Keppra prior to admission. -No seizures noted. -Patient received a dose of IV Keppra in the ED. -Continue Keppra 500 mg IV every 12 hours until able to tolerate oral intake adequately and could transition back to home regimen of oral Keppra.   9.  Asthma -Stable.  10.  Peptic ulcer disease -Place on Pepcid.   11.  Acute metabolic encephalopathy -Likely secondary to alcohol withdrawal. -Alcohol level within normal limits. -Thiamine level pending.   -If no significant improvement will get a head CT tomorrow. -Change thiamine to 500 mg IV 3 times daily x5 days, then thiamine 250 mg IV 3 times daily x5 days, then thiamine 100 mg daily.  -  TOC PLAN OF CARE Pt and ot have suggested snf placement due to weakness and deconditioning. Pt going through withdrawal presently and will follow to see outcome.  Expected Discharge Plan: Skilled Nursing Facility Barriers to Discharge: Continued Medical Work up   Patient Goals and CMS Choice Patient states their goals for this hospitalization and ongoing recovery are:: unable to state at this time CMS Medicare.gov Compare Post Acute Care list provided to:: Patient Represenative (must comment) (mother) Choice offered to / list presented to : Parent  Expected Discharge Plan and Services Expected Discharge Plan: [employer]   Discharge Planning Services: CM Consult Post Acute Care Choice: [place] Living arrangements for the past 2 months: Single Family Home                                      Prior Living Arrangements/Services Living arrangements for the past 2 months: Single Family Home Lives with:: Self                   Activities  of Daily Living Home Assistive Devices/Equipment: None ADL Screening (condition at time of admission) Patient's cognitive ability adequate to safely complete daily activities?: No (patient is lethargic) Is the patient deaf or have difficulty hearing?: No Does the patient have difficulty seeing, even when wearing glasses/contacts?: No Does the patient have difficulty concentrating, remembering, or making decisions?: Yes (patient is lethargic) Patient able to express need for assistance with ADLs?: Yes Does the patient have difficulty dressing or bathing?: Yes (patient is lethargic) Independently performs ADLs?: No (patient is lethargic) Communication: Independent Dressing (OT): Needs assistance Is this a change from baseline?: Change from baseline, expected to last >3 days Grooming: Needs assistance Is this a change from baseline?: Change from baseline, expected to last >3 days Feeding: Needs assistance Is this a change from baseline?: Change from baseline, expected to last >3 days Bathing: Needs assistance Is this a change from baseline?: Change from baseline, expected to last >3 days Toileting: Needs assistance Is this a change from baseline?: Change from baseline, expected to last >3days In/Out Bed: Needs assistance Is this a change from baseline?: Change from baseline, expected to last >3 days Walks in Home: Needs assistance Is this a change from baseline?: Change from baseline, expected to last >3 days Does the  patient have difficulty walking or climbing stairs?: Yes (patient is lethargic and weak) Weakness of Legs: Both Weakness of Arms/Hands: None  Permission Sought/Granted                  Emotional Assessment   Attitude/Demeanor/Rapport: Unable to Assess Affect (typically observed): Unable to Assess Orientation: : Fluctuating Orientation (Suspected and/or reported Sundowners) Alcohol / Substance Use: Illicit Drugs, Alcohol Use Psych Involvement: No  (comment)  Admission diagnosis:  Hypokalemia [E87.6] Pancolitis (HCC) [K51.00] Patient Active Problem List   Diagnosis Date Noted   Acute metabolic encephalopathy 08/09/2021   Transaminitis 08/08/2021   Alcohol withdrawal (HCC) 08/08/2021   Hypokalemia 08/07/2021   Hypomagnesemia 08/07/2021   QT prolongation 08/07/2021   Hypoglycemia 08/07/2021   Alcohol abuse 08/07/2021   Pancolitis (HCC) 08/06/2021   PCP:  Oneita Hurt No Pharmacy:   [facility] - [place], [place] - [address] [place] [address] Phone: [phone] Fax: [phone]     Social Determinants of Health (SDOH) Interventions    Readmission Risk Interventions No flowsheet data found.

## 2021-08-11 NOTE — Progress Notes (Signed)
PROGRESS NOTE    Brandi Romero  [id number] DOB: [birth year] DOA: 08/06/2021 PCP: Pcp, No   Chief Complaint  Patient presents with   Generalized Body Aches    Brief Narrative:  Patient 45 year old female history of asthma, seizure disorder, alcohol use, marijuana use, PUD, GI bleed, anemia, colonic polyps presenting to the ED with one 1 week history of flulike symptoms with body aches, cough, shortness of breath, chest pain, nausea vomiting generalized abdominal pain.  In the ED patient noted to have a CBG of 56.  CT angiogram chest done negative for PE.  CT abdomen and pelvis concerning for pancolitis, hepatomegaly, poorly defined uncinate process of the pancreas, nonspecific vague fat stranding along with bowel mesentery.  Patient admitted, placed empirically on IV antibiotics, PPI, supportive care.  Patient noted to be agitated confused likely in alcohol withdrawal and placed on Ativan withdrawal protocol.  Librium detox protocol added to regiment.  Patient also noted to have QTC prolongation electrolyte abnormalities.  Due to worsening transaminitis and pancolitis GI consulted for further evaluation and management.   Assessment & Plan:   Principal Problem:   Pancolitis ([place]) Active Problems:   Alcohol withdrawal (HCC)   Hypokalemia   Hypomagnesemia   QT prolongation   Hypoglycemia   Alcohol abuse   Transaminitis   Acute metabolic encephalopathy  1 pancolitis -Patient presented with generalized abdominal pain, diarrhea, intractable nausea and vomiting noted to have a significant lactic acidosis also noted to be significantly dehydrated. -No leukocytosis noted. -UDS done positive for THC which was found may be contributing to nausea and vomiting. -Patient also noted to have an elevated alcohol level on admission. -CT abdomen and pelvis done concerning for pancolitis. -C. difficile PCR done with a positive C. difficile antigen, negative toxin, PCR negative. -Patient however  presented with a clinical colitis with CT evidence of pancolitis and as such patient started on oral vancomycin. -Continue IV Flagyl, oral vancomycin.. -Patient seen in consultation by GI who are recommending discontinuation of IV Rocephin which has been done. -Saline lock IV fluids.   -Diet advanced to a soft diet this morning which patient is tolerating per mother.   -Supportive care.    2.  Transaminitis -Likely secondary to alcohol use. -Patient with history of alcohol use and currently in alcohol withdrawal. -CT abdomen and pelvis done with hepatomegaly, poorly defined uncinate process of the pancreas, no gallstones, no gallbladder wall thickening, no pericholecystic fluid, no biliary dilatation. -Acute hepatitis panel negative. -LFTs trending down. -HIV negative. -INR of 1.4. -GI consulted and feel transaminitis likely secondary to alcoholic hepatitis. -Supportive care.  3.  QTC prolongation -Replete electrolytes, keep magnesium around 2, potassium around 4. -Repeat EKG still with QT prolongation.   -Repeat EKG tomorrow.    4.  Hypokalemia/hypomagnesemia/hypophosphatemia -Repleted.  Potassium at 4.3, phosphorus at 2.8, magnesium at 2 -Repeat labs in the morning..  5.  Alcohol withdrawal syndrome -Patient noted to be agitated, some confusion, likely in alcohol withdrawal. -Alcohol level on admission was 247. -Patient was on the Ativan withdrawal protocol. -Librium detox protocol added to current regimen.   -Continue thiamine to 500 mg IV 3 times daily x5 days, then 250 mg IV 3 times daily x5 days, then thiamine 100 mg daily. -Continue multivitamin, folic acid. -Saline lock IV fluids. -TOC consulted.  6.  Microcytic anemia -Likely dilutional and also secondary to folate deficiency and iron deficiency in menstruating female. -Patient with no overt bleeding. - hemoglobin currently 7.9.  Follow. -Anemia panel consistent with folate  deficiency. -Folic acid  daily. -Transfusion threshold hemoglobin < 7.  -Follow H&H.  7.  Hypoglycemia -Likely secondary to poor oral intake. -CBG 76 this morning.  -IV fluids saline lock.  -Follow.    8.  Seizure disorder -Patient noted to be on Keppra prior to admission. -No seizures noted. -Patient received a dose of IV Keppra in the ED. -Continue Keppra 500 mg IV every 12 hours until able to tolerate oral intake adequately and could transition back to home regimen of oral Keppra hopefully in the next 1 to 2 days.   9.  Asthma -Stable.  10.  Peptic ulcer disease -Pepcid.  11.  Acute metabolic encephalopathy -Likely secondary to alcohol withdrawal. -Alcohol level within normal limits. -Thiamine level pending.  -Seems less drowsy and more alert today. -Hold off on head imaging for now. -Continue thiamine 500 mg IV 3 times daily x5 days, then thiamine 250 mg IV 3 times daily x5 days, then thiamine 100 mg daily. -Supportive care   DVT prophylaxis: SCDs Code Status: Full Family Communication: Updated mother, Peter Congo at bedside. Disposition:   Status is: Inpatient  Remains inpatient appropriate because: Severity of illness       Consultants:  GI: Dr. Therisa Doyne 08/08/2021  Procedures:  CT angiogram chest 08/06/2021 CT abdomen and pelvis 08/06/2021 Chest x-ray 08/06/2021   Antimicrobials: Oral vancomycin 08/08/2021>>>>> IV Rocephin 08/07/2021>>>>> 08/08/2021 IV Flagyl 08/07/2021>>>>>   Subjective: Patient laying in bed.  A little more alert today.  Less drowsy.  Following some simple commands.  Answering questions.  Denies any chest pain.  No shortness of breath.  No abdominal pain.  States some smokes about 3 cigarettes a day.  Denies any significant diarrhea.  Stated tolerated breakfast this morning she ate some eggs and bacon.  Patient in restraints.   Objective: Vitals:   08/10/21 1813 08/10/21 1956 08/11/21 0000 08/11/21 0425  BP: (!) 144/107 (!) 148/110 (!) 145/100   Pulse: (!) 107  (!) 108 (!) 109 (!) 106  Resp: 20 20    Temp: 98.3 F (36.8 C) 98.5 F (36.9 C)    TempSrc: Oral Oral    SpO2: 100% 100%  100%  Weight:    57.9 kg  Height:        Intake/Output Summary (Last 24 hours) at 08/11/2021 1206 Last data filed at 08/11/2021 0851 Gross per 24 hour  Intake 670 ml  Output 900 ml  Net -230 ml    Filed Weights   08/06/21 1415 08/11/21 0425  Weight: 59 kg 57.9 kg    Examination:  General exam: Drowsy but more alert today. Respiratory system: Lungs clear to auscultation bilaterally.  No wheezes, no crackles, no rhonchi.  Normal respiratory effort.  Speaking in full sentences. Cardiovascular system: Regular rate and rhythm no murmurs rubs or gallops.  No JVD.  No lower extremity edema. Gastrointestinal system: Abdomen is soft, nontender, nondistended, positive bowel sounds.  No rebound.  No guarding.   Central nervous system: Drowsy but more alert..  Moving extremities spontaneously.. No focal neurological deficits. Extremities: Symmetric 5 x 5 power. Skin: No rashes, lesions or ulcers Psychiatry: Judgement and insight appear poor.. Mood & affect unable to assess.     Data Reviewed: I have personally reviewed following labs and imaging studies  CBC: Recent Labs  Lab 08/07/21 2139 08/08/21 0415 08/09/21 0334 08/10/21 0333 08/11/21 0313  WBC 4.0 5.9 5.1 6.7 7.2  NEUTROABS 2.4 4.8 3.3 4.4 4.6  HGB 8.2* 8.7* 8.5* 8.5* 7.9*  HCT 26.4*  27.9* 27.7* 27.3* 25.5*  MCV 73.7* 74.0* 74.5* 73.6* 74.6*  PLT 162 148* 151 168 152     Basic Metabolic Panel: Recent Labs  Lab 08/07/21 0334 08/07/21 0336 08/07/21 2139 08/08/21 0415 08/09/21 0334 08/10/21 0333 08/11/21 0313  NA  --  135  --  139 136 137 138  K  --  2.9* 3.5 3.8 4.8 4.9 4.3  CL  --  98  --  106 106 109 109  CO2  --  24  --  24 24 24 24   GLUCOSE  --  102*  --  108* 97 98 82  BUN  --  5*  --  <5* <5* <5* <5*  CREATININE  --  0.55  --  0.56 0.43* 0.41* 0.56  CALCIUM  --  7.2*  --  7.0*  7.7* 8.0* 8.2*  MG 1.5*  --   --  1.7 1.6* 1.7 2.0  PHOS  --   --   --  1.6* 1.9* 2.8  --      GFR: Estimated Creatinine Clearance: 81.6 mL/min (by C-G formula based on SCr of 0.56 mg/dL).  Liver Function Tests: Recent Labs  Lab 08/07/21 0336 08/08/21 0415 08/09/21 0334 08/10/21 0333 08/11/21 0313  AST 51* 232* 90* 38 29  ALT 14 21 20 15 13   ALKPHOS 127* 122 136* 126 116  BILITOT 1.1 1.5* 1.5* 1.4* 1.3*  PROT 5.9* 5.5* 6.2* 6.5 6.1*  ALBUMIN 2.3* 2.1* 2.4* 2.6* 2.4*     CBG: Recent Labs  Lab 08/10/21 1600 08/10/21 1955 08/11/21 0423 08/11/21 0737 08/11/21 1035  GLUCAP 88 82 86 76 66*      Recent Results (from the past 240 hour(s))  Resp Panel by RT-PCR (Flu A&B, Covid) Nasopharyngeal Swab     Status: None   Collection Time: 08/06/21  2:15 PM   Specimen: Nasopharyngeal Swab; Nasopharyngeal(NP) swabs in vial transport medium  Result Value Ref Range Status   SARS Coronavirus 2 by RT PCR NEGATIVE NEGATIVE Final    Comment: (NOTE) SARS-CoV-2 target nucleic acids are NOT DETECTED.  The SARS-CoV-2 RNA is generally detectable in upper respiratory specimens during the acute phase of infection. The lowest concentration of SARS-CoV-2 viral copies this assay can detect is 138 copies/mL. A negative result does not preclude SARS-Cov-2 infection and should not be used as the sole basis for treatment or other patient management decisions. A negative result may occur with  improper specimen collection/handling, submission of specimen other than nasopharyngeal swab, presence of viral mutation(s) within the areas targeted by this assay, and inadequate number of viral copies(<138 copies/mL). A negative result must be combined with clinical observations, patient history, and epidemiological information. The expected result is Negative.  Fact Sheet for Patients:  EntrepreneurPulse.com.au  Fact Sheet for Healthcare Providers:   IncredibleEmployment.be  This test is no t yet approved or cleared by the [place] FDA and  has been authorized for detection and/or diagnosis of SARS-CoV-2 by FDA under an Emergency Use Authorization (EUA). This EUA will remain  in effect (meaning this test can be used) for the duration of the COVID-19 declaration under Section 564(b)(1) of the Act, 21 U.S.C.section 360bbb-3(b)(1), unless the authorization is terminated  or revoked sooner.       Influenza A by PCR NEGATIVE NEGATIVE Final   Influenza B by PCR NEGATIVE NEGATIVE Final    Comment: (NOTE) The Xpert Xpress SARS-CoV-2/FLU/RSV plus assay is intended as an aid in the diagnosis of influenza from Nasopharyngeal swab  specimens and should not be used as a sole basis for treatment. Nasal washings and aspirates are unacceptable for Xpert Xpress SARS-CoV-2/FLU/RSV testing.  Fact Sheet for Patients: EntrepreneurPulse.com.au  Fact Sheet for Healthcare Providers: IncredibleEmployment.be  This test is not yet approved or cleared by the [place] FDA and has been authorized for detection and/or diagnosis of SARS-CoV-2 by FDA under an Emergency Use Authorization (EUA). This EUA will remain in effect (meaning this test can be used) for the duration of the COVID-19 declaration under Section 564(b)(1) of the Act, 21 U.S.C. section 360bbb-3(b)(1), unless the authorization is terminated or revoked.  Performed at [facility], [address] [place], [place] [address]   Blood culture (routine x 2)     Status: Abnormal   Collection Time: 08/06/21  7:35 PM   Specimen: BLOOD  Result Value Ref Range Status   Specimen Description   Final    BLOOD BLOOD RIGHT ARM Performed at [facility], [address] [place], [place] [address]    Special Requests   Final    BOTTLES DRAWN AEROBIC AND ANAEROBIC Blood Culture adequate volume Performed at [facility], [address] [place], [place] [address]    Culture  Setup Time   Final    GRAM POSITIVE COCCI IN BOTH AEROBIC AND ANAEROBIC BOTTLES CRITICAL RESULT CALLED TO, READ BACK BY AND VERIFIED WITH: PHARMD MICHELLE LILLISTON  08/08/2021 @0026  BY JW    Culture (A)  Final    STAPHYLOCOCCUS EPIDERMIDIS THE SIGNIFICANCE OF ISOLATING THIS ORGANISM FROM A SINGLE VENIPUNCTURE CANNOT BE PREDICTED WITHOUT FURTHER CLINICAL AND CULTURE CORRELATION. SUSCEPTIBILITIES AVAILABLE ONLY ON REQUEST. Performed at [facility], [place] [address], [place], [place] [address]    Report Status 08/09/2021 FINAL  Final  Blood Culture ID Panel (Reflexed)     Status: Abnormal   Collection Time: 08/06/21  7:35 PM  Result Value Ref Range Status   Enterococcus faecalis NOT DETECTED NOT DETECTED Final   Enterococcus Faecium NOT DETECTED NOT DETECTED Final   Listeria monocytogenes NOT DETECTED NOT DETECTED Final   Staphylococcus species DETECTED (A) NOT DETECTED Final    Comment: CRITICAL RESULT CALLED TO, READ BACK BY AND VERIFIED WITH: PHARMD MICHELLE LILLISTON  08/08/2021 @0026  BY JW    Staphylococcus aureus (BCID) NOT DETECTED NOT DETECTED Final   Staphylococcus epidermidis DETECTED (A) NOT DETECTED Final    Comment: CRITICAL RESULT CALLED TO, READ BACK BY AND VERIFIED WITH: PHARMD MICHELLE LILLISTON  08/08/2021 @0026  BY JW    Staphylococcus lugdunensis NOT DETECTED NOT DETECTED Final   Streptococcus species NOT DETECTED NOT DETECTED Final   Streptococcus agalactiae NOT DETECTED NOT DETECTED Final   Streptococcus pneumoniae NOT DETECTED NOT DETECTED Final   Streptococcus pyogenes NOT DETECTED NOT DETECTED Final   A.calcoaceticus-baumannii NOT DETECTED NOT DETECTED Final   Bacteroides fragilis NOT DETECTED NOT DETECTED Final   Enterobacterales NOT DETECTED NOT DETECTED Final   Enterobacter cloacae complex NOT DETECTED NOT DETECTED Final   Escherichia coli NOT DETECTED NOT DETECTED Final    Klebsiella aerogenes NOT DETECTED NOT DETECTED Final   Klebsiella oxytoca NOT DETECTED NOT DETECTED Final   Klebsiella pneumoniae NOT DETECTED NOT DETECTED Final   Proteus species NOT DETECTED NOT DETECTED Final   Salmonella species NOT DETECTED NOT DETECTED Final   Serratia marcescens NOT DETECTED NOT DETECTED Final   Haemophilus influenzae NOT DETECTED NOT DETECTED Final   Neisseria meningitidis NOT DETECTED NOT DETECTED Final  Pseudomonas aeruginosa NOT DETECTED NOT DETECTED Final   Stenotrophomonas maltophilia NOT DETECTED NOT DETECTED Final   Candida albicans NOT DETECTED NOT DETECTED Final   Candida auris NOT DETECTED NOT DETECTED Final   Candida glabrata NOT DETECTED NOT DETECTED Final   Candida krusei NOT DETECTED NOT DETECTED Final   Candida parapsilosis NOT DETECTED NOT DETECTED Final   Candida tropicalis NOT DETECTED NOT DETECTED Final   Cryptococcus neoformans/gattii NOT DETECTED NOT DETECTED Final   Methicillin resistance mecA/C NOT DETECTED NOT DETECTED Final    Comment: Performed at [facility], [address] [place], [place] [address]  Blood culture (routine x 2)     Status: None (Preliminary result)   Collection Time: 08/06/21 11:17 PM   Specimen: BLOOD  Result Value Ref Range Status   Specimen Description   Final    BLOOD BLOOD LEFT FOREARM Performed at [facility], [address], [place], [place] [address]    Special Requests   Final    BOTTLES DRAWN AEROBIC ONLY Blood Culture adequate volume Performed at [facility], [address] [place], [place] [address]    Culture   Final    NO GROWTH 4 DAYS Performed at [facility], [address], [place], [place] [address]    Report Status PENDING  Incomplete  Gastrointestinal Panel by PCR , Stool     Status: None   Collection Time: 08/08/21  5:23 AM   Specimen: STOOL  Result Value Ref Range Status   Campylobacter species NOT DETECTED NOT DETECTED Final    Plesimonas shigelloides NOT DETECTED NOT DETECTED Final   Salmonella species NOT DETECTED NOT DETECTED Final   Yersinia enterocolitica NOT DETECTED NOT DETECTED Final   Vibrio species NOT DETECTED NOT DETECTED Final   Vibrio cholerae NOT DETECTED NOT DETECTED Final   Enteroaggregative E coli (EAEC) NOT DETECTED NOT DETECTED Final   Enteropathogenic E coli (EPEC) NOT DETECTED NOT DETECTED Final   Enterotoxigenic E coli (ETEC) NOT DETECTED NOT DETECTED Final   Shiga like toxin producing E coli (STEC) NOT DETECTED NOT DETECTED Final   Shigella/Enteroinvasive E coli (EIEC) NOT DETECTED NOT DETECTED Final   Cryptosporidium NOT DETECTED NOT DETECTED Final   Cyclospora cayetanensis NOT DETECTED NOT DETECTED Final   Entamoeba histolytica NOT DETECTED NOT DETECTED Final   Giardia lamblia NOT DETECTED NOT DETECTED Final   Adenovirus F40/41 NOT DETECTED NOT DETECTED Final   Astrovirus NOT DETECTED NOT DETECTED Final   Norovirus GI/GII NOT DETECTED NOT DETECTED Final   Rotavirus A NOT DETECTED NOT DETECTED Final   Sapovirus (I, II, IV, and V) NOT DETECTED NOT DETECTED Final    Comment: Performed at [facility], [address] [place], [place] [address]  C Difficile Quick Screen w PCR reflex     Status: Abnormal   Collection Time: 08/08/21  5:23 AM   Specimen: STOOL  Result Value Ref Range Status   C Diff antigen POSITIVE (A) NEGATIVE Final   C Diff toxin NEGATIVE NEGATIVE Final   C Diff interpretation Results are indeterminate. See PCR results.  Final    Comment: Performed at [facility], [address] [place], [place] [address]  C. Diff by PCR, Reflexed     Status: None   Collection Time: 08/08/21  5:23 AM  Result Value Ref Range Status   Toxigenic C. Difficile by PCR NEGATIVE NEGATIVE Final    Comment: Patient is colonized with non toxigenic C.  difficile. May not need treatment unless significant symptoms are present. Performed at [facility], [place] [address], [place], [place] [address]           Radiology Studies: No results found.      Scheduled Meds:  chlordiazePOXIDE  25 mg Oral BH-qamhs   Followed by   chlordiazePOXIDE  25 mg Oral Daily   chlorhexidine  15 mL Mouth Rinse BID   Chlorhexidine Gluconate Cloth  6 each Topical Daily   folic acid  1 mg Oral Daily   LORazepam  0-4 mg Oral Q6H   Followed by   Derrill Memo ON 08/13/2021] LORazepam  0-4 mg Oral Q12H   mouth rinse  15 mL Mouth Rinse q12n4p   multivitamin with minerals  1 tablet Oral Daily   nicotine  21 mg Transdermal Daily   sodium chloride flush  10-40 mL Intracatheter Q12H   sucralfate  1 g Oral TID WC & HS   [START ON 08/20/2021] thiamine  100 mg Oral Daily   Or   [START ON 08/20/2021] thiamine  100 mg Intravenous Daily   vancomycin  125 mg Oral QID   Continuous Infusions:  famotidine (PEPCID) IV 20 mg (08/11/21 1104)   levETIRAcetam 500 mg (08/11/21 0520)   metronidazole 500 mg (08/11/21 1009)   [START ON 08/15/2021] thiamine injection     thiamine injection 500 mg (08/11/21 1015)     LOS: 4 days    Time spent: 40 minutes    Irine Seal, MD [facility]   To contact the attending provider between 7A-7P or the covering provider during after hours 7P-7A, please log into the web site [email or web address] and access using universal [place] password for that web site. If you do not have the password, please call the hospital operator.  08/11/2021, 12:06 PM

## 2021-08-11 NOTE — TOC Progression Note (Signed)
Transition of Care [facility]) - Progression Note    Patient Details  Name: Brandi Romero MRN: [id number] Date of Birth: [birth year]  Transition of Care [facility]) CM/SW Contact  Golda Acre, RN Phone Number: 08/11/2021, 1:44 PM  Clinical Narrative:    Passar number and fl2 done.  Pt is in withdrawal and does not have insurance will hold on sending fl2 out.   Expected Discharge Plan: Skilled Nursing Facility Barriers to Discharge: Continued Medical Work up  Expected Discharge Plan and Services Expected Discharge Plan: Skilled Nursing Facility   Discharge Planning Services: CM Consult Post Acute Care Choice: Skilled Nursing Facility Living arrangements for the past 2 months: Single Family Home                                       Social Determinants of Health (SDOH) Interventions    Readmission Risk Interventions No flowsheet data found.

## 2021-08-11 NOTE — Progress Notes (Signed)
OT Cancellation Note  Patient Details Name: Brandi Romero MRN: [id number] DOB: March 10, 1976   Cancelled Treatment:    Reason Eval/Treat Not Completed: Other (comment) per nursing, patient remains inappropriate for OT evaluation at this time. OT to sign off at this time. Please re order when patient is medically stable and off restraints. Thank you   Sharyn Blitz OTR/L, MS Acute Rehabilitation Department Office# [phone] Pager# [phone]    08/11/2021, 10:36 AM

## 2021-08-12 ENCOUNTER — Encounter (HOSPITAL_COMMUNITY): Payer: Self-pay | Admitting: Internal Medicine

## 2021-08-12 LAB — MAGNESIUM: Magnesium: 1.4 mg/dL — ABNORMAL LOW (ref 1.7–2.4)

## 2021-08-12 LAB — RENAL FUNCTION PANEL
Albumin: 2.5 g/dL — ABNORMAL LOW (ref 3.5–5.0)
Anion gap: 7 (ref 5–15)
BUN: <5 mg/dL — ABNORMAL LOW (ref 6–20)
CO2: 23 mmol/L (ref 22–32)
Calcium: 8.4 mg/dL — ABNORMAL LOW (ref 8.9–10.3)
Chloride: 106 mmol/L (ref 98–111)
Creatinine, Ser: 0.65 mg/dL (ref 0.44–1.00)
GFR, Estimated: >60 mL/min (ref >60)
Glucose, Bld: 83 mg/dL (ref 70–99)
Phosphorus: 3.5 mg/dL (ref 2.5–4.6)
Potassium: 3.8 mmol/L (ref 3.5–5.1)
Sodium: 136 mmol/L (ref 135–145)

## 2021-08-12 LAB — CULTURE, BLOOD (ROUTINE X 2)
Culture: NO GROWTH
Special Requests: ADEQUATE

## 2021-08-12 LAB — CBC
HCT: 26.7 % — ABNORMAL LOW (ref 36.0–46.0)
Hemoglobin: 8.3 g/dL — ABNORMAL LOW (ref 12.0–15.0)
MCH: 23.2 pg — ABNORMAL LOW (ref 26.0–34.0)
MCHC: 31.1 g/dL (ref 30.0–36.0)
MCV: 74.6 fL — ABNORMAL LOW (ref 80.0–100.0)
Platelets: 165 10*3/uL (ref 150–400)
RBC: 3.58 MIL/uL — ABNORMAL LOW (ref 3.87–5.11)
RDW: 22.2 % — ABNORMAL HIGH (ref 11.5–15.5)
WBC: 8.6 10*3/uL (ref 4.0–10.5)
nRBC: 0.2 % (ref 0.0–0.2)

## 2021-08-12 LAB — GLUCOSE, CAPILLARY
Glucose-Capillary: 74 mg/dL (ref 70–99)
Glucose-Capillary: 80 mg/dL (ref 70–99)
Glucose-Capillary: 90 mg/dL (ref 70–99)
Glucose-Capillary: 89 mg/dL (ref 70–99)

## 2021-08-12 MED ORDER — FUROSEMIDE 10 MG/ML IJ SOLN
20.0000 mg | Freq: Once | INTRAMUSCULAR | Status: AC
  Administered 2021-08-12: 20 mg via INTRAVENOUS
  Filled 2021-08-12: qty 2

## 2021-08-12 MED ORDER — MAGNESIUM SULFATE 4 GM/100ML IV SOLN
4.0000 g | Freq: Once | INTRAVENOUS | Status: AC
  Administered 2021-08-12: 4 g via INTRAVENOUS
  Filled 2021-08-12: qty 100

## 2021-08-12 NOTE — Progress Notes (Signed)
Patient's mother called for up date at 1100  This nurse was in a progression meeting and let the mother know that she would be called back. Mother called again while this nurse was at lunch. Mother then called at 1330, called was being transferred to this nurse then the mother then hung up per secretary. Then the mother called back, this nurse spoke to her. First, the mother stated we hung up on her. Tried to explain to mother that did not happen. Started to give update and mother started to raise her voice and state the reason why her daughter is confused because of the ativan we were giving her. This nurse tried to explain why the ativan was given and that her daughter has only had 1 mg of ativan today. The mother proceeded to raise her voice and would not listen to any other information that the nurse could give. Mother stated she would come down and take care of the situation when she would get off of work. Mother terminated call.

## 2021-08-12 NOTE — TOC Progression Note (Signed)
Transition of Care [facility]) - Progression Note    Patient Details  Name: Brandi Romero MRN: [id number] Date of Birth: 1976/01/30  Transition of [facility]) CM/SW Contact  Golda Acre, RN Phone Number: 08/12/2021, 9:43 AM  Clinical Narrative:    [facility] Cao sent out to [place] [facility], [place], and [place].  Patient has no insurance may not be able to place.   Expected Discharge Plan: Skilled Nursing Facility Barriers to Discharge: Continued Medical Work up  Expected Discharge Plan and Services Expected Discharge Plan: Skilled Nursing Facility   Discharge Planning Services: CM Consult Post Acute Care Choice: Skilled Nursing Facility Living arrangements for the past 2 months: Single Family Home                                       Social Determinants of Health (SDOH) Interventions    Readmission Risk Interventions No flowsheet data found.

## 2021-08-12 NOTE — Progress Notes (Signed)
Physical Therapy Treatment Patient Details Name: Brandi Romero MRN: [id number] DOB: [birth year] Today's Date: 08/12/2021   History of Present Illness 45 year old female history of asthma, seizure disorder, alcohol use, marijuana use, PUD, GI bleed, anemia, colonic polyps presenting to the ED with one 1 week history of flulike symptoms with body aches, cough, shortness of breath, chest pain, nausea vomiting generalized abdominal pain.  Patient noted to be agitated confused likely in alcohol withdrawal and placed on Ativan withdrawal protocol.  Librium detox protocol added to regiment.  Patient also noted to have QTC prolongation electrolyte abnormalities.  Due to worsening transaminitis and pancolitis GI consulted for further evaluation and management.    PT Comments    Patient progressing this session with increased time given to increased level of arousal at EOB pt participating in grooming task.  Then with attempts to stand over 3 trials mild improvement in balance.  Then pt initiating ambulation with confusion and impulsivity despite cues for safety due to imbalance, IV and purewick lines.  Needed max A to sit while lines made portable.  She then ambulated in hallway with RW and mod A +2 for safety.  She will continue to benefit from skilled PT in the acute setting.  Given cognitive deficits continue to recommend STSNF prior to home, but if improved could progress home with 24 hour assist and RW vs wheelchair.  PT to continue to follow.   Recommendations for follow up therapy are one component of a multi-disciplinary discharge planning process, led by the attending physician.  Recommendations may be updated based on patient status, additional functional criteria and insurance authorization.  Follow Up Recommendations  Skilled nursing-short term rehab (<3 hours/day)     Assistance Recommended at Discharge Frequent or constant Supervision/Assistance  Equipment Recommendations  Rolling walker (2  wheels)    Recommendations for Other Services       Precautions / Restrictions Precautions Precautions: Fall Restrictions Weight Bearing Restrictions: No     Mobility  Bed Mobility Overal bed mobility: Needs Assistance Bed Mobility: Supine to Sit     Supine to sit: Max assist Sit to supine: +2 for safety/equipment;Mod assist   General bed mobility comments: assist for rolling and legs off bed and lifting trunk upright, pt lethargic; assist with NT to supine for safety    Transfers Overall transfer level: Needs assistance Equipment used: Rolling walker (2 wheels) Transfers: Sit to/from Stand Sit to Stand: Max assist           General transfer comment: lifting help to stand, initially knees flexed and posterior bias, stood x 3 to RW, then stood with face to face and assist for balance and getting knees extended    Ambulation/Gait Ambulation/Gait assistance: Mod assist;+2 safety/equipment Gait Distance (Feet): 130 Feet Assistive device: Rolling walker (2 wheels) Gait Pattern/deviations: Step-through pattern;Decreased stride length;Trunk flexed;Ataxic;Staggering right;Staggering left;Knee flexed in stance - left;Knee flexed in stance - right       General Gait Details: patient walking in room despite cues to sit due to IV and purewick hookups.  assisted into chair for safety max A till could get IV loose, then up with A to RW and ambulated in hallway with A for walker proximity, balance, safety and to turn the walker and return to the room and NT in hallway assisting with IV   Stairs             Wheelchair Mobility    Modified Rankin (Stroke Patients Only)       Balance  Overall balance assessment: Needs assistance Sitting-balance support: Feet supported   Sitting balance - Comments: initially at EOB about 10 minutes working on upright posture, attention to functional task placing lotion on hands, legs and for increased level of arousal with min A to  close S   Standing balance support: During functional activity;Bilateral upper extremity supported Standing balance-Leahy Scale: Poor Standing balance comment: balance with min to mod A and UE support; initially knees flexed, improved over time with reps                            Cognition Arousal/Alertness: Lethargic Behavior During Therapy: Impulsive;Flat affect Overall Cognitive Status: Impaired/Different from baseline Area of Impairment: Following commands;Safety/judgement;Attention;Problem solving;Orientation                 Orientation Level: Disoriented to;Place;Time;Situation Current Attention Level: Sustained   Following Commands: Follows one step commands inconsistently Safety/Judgement: Decreased awareness of deficits;Decreased awareness of safety   Problem Solving: Slow processing;Difficulty sequencing;Requires verbal cues;Requires tactile cues General Comments: Initially lethargic so sat EOB for some time getting her roused, then started getting up to stand and walk despite hooked up to purewick and IV with pick line.  forced her into chair to free IV nd remove pure wick, when cued about IV she stated, "I don't care, I am going next week to the doctor to get it out."        Exercises      General Comments        Pertinent Vitals/Pain Pain Assessment: Faces Faces Pain Scale: No hurt Breathing: normal Negative Vocalization: none Facial Expression: smiling or inexpressive Body Language: relaxed Consolability: no need to console PAINAD Score: 0    Home Living                          Prior Function            PT Goals (current goals can now be found in the care plan section) Progress towards PT goals: Progressing toward goals    Frequency    Min 3X/week      PT Plan      Co-evaluation              AM-PAC PT "6 Clicks" Mobility   Outcome Measure  Help needed turning from your back to your side while in a flat  bed without using bedrails?: A Lot Help needed moving from lying on your back to sitting on the side of a flat bed without using bedrails?: Total Help needed moving to and from a bed to a chair (including a wheelchair)?: A Lot Help needed standing up from a chair using your arms (e.g., wheelchair or bedside chair)?: A Lot Help needed to walk in hospital room?: A Lot Help needed climbing 3-5 steps with a railing? : Total 6 Click Score: 10    End of Session   Activity Tolerance: Patient tolerated treatment well Patient left: in bed;with call bell/phone within reach;with nursing/sitter in room;with bed alarm set;with restraints reapplied   PT Visit Diagnosis: Difficulty in walking, not elsewhere classified (R26.2);Other symptoms and signs involving the nervous system (R29.898);Other abnormalities of gait and mobility (R26.89)     Time: 1100-1156 PT Time Calculation (min) (ACUTE ONLY): 56 min  Charges:  $Gait Training: 23-37 mins $Therapeutic Activity: 23-37 mins  Sheran Lawless, PT Acute Rehabilitation Services Pager:[phone] Office:[phone] 08/12/2021    Brandi Romero 08/12/2021, 1:52 PM
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
## 2021-08-12 NOTE — Progress Notes (Signed)
PROGRESS NOTE    Brandi Romero  [id number] DOB: January 06, 1976 DOA: 08/06/2021 PCP: Pcp, No   Chief Complaint  Patient presents with   Generalized Body Aches    Brief Narrative:  Patient 45 year old female history of asthma, seizure disorder, alcohol use, marijuana use, PUD, GI bleed, anemia, colonic polyps presenting to the ED with one 1 week history of flulike symptoms with body aches, cough, shortness of breath, chest pain, nausea vomiting generalized abdominal pain.  In the ED patient noted to have a CBG of 56.  CT angiogram chest done negative for PE.  CT abdomen and pelvis concerning for pancolitis, hepatomegaly, poorly defined uncinate process of the pancreas, nonspecific vague fat stranding along with bowel mesentery.  Patient admitted, placed empirically on IV antibiotics, PPI, supportive care.  Patient noted to be agitated confused likely in alcohol withdrawal and placed on Ativan withdrawal protocol.  Librium detox protocol added to regiment.  Patient also noted to have QTC prolongation electrolyte abnormalities.  Due to worsening transaminitis and pancolitis GI consulted for further evaluation and management.   Assessment & Plan:   Principal Problem:   Pancolitis ([place]) Active Problems:   Alcohol withdrawal ([facility])   Hypokalemia   Hypomagnesemia   QT prolongation   Hypoglycemia   Alcohol abuse   Transaminitis   Acute metabolic encephalopathy  1 pancolitis -Patient presented with generalized abdominal pain, diarrhea, intractable nausea and vomiting noted to have a significant lactic acidosis also noted to be significantly dehydrated. -No leukocytosis noted. -UDS done positive for THC which was found may be contributing to nausea and vomiting. -Patient also noted to have an elevated alcohol level on admission. -CT abdomen and pelvis done concerning for pancolitis. -C. difficile PCR done with a positive C. difficile antigen, negative toxin, PCR negative. -Patient however  presented with a clinical colitis with CT evidence of pancolitis and as such patient started on oral vancomycin. -Continue IV Flagyl, oral vancomycin.. -Patient seen in consultation by GI who are recommending discontinuation of IV Rocephin which has been done. -Saline lock IV fluids.   -Diet advanced to a soft diet which patient is tolerating.   -Supportive care.    2.  Transaminitis -Likely secondary to alcohol use. -Patient with history of alcohol use and currently in alcohol withdrawal. -CT abdomen and pelvis done with hepatomegaly, poorly defined uncinate process of the pancreas, no gallstones, no gallbladder wall thickening, no pericholecystic fluid, no biliary dilatation. -Acute hepatitis panel negative. -LFTs trending down. -HIV negative. -INR of 1.4. -GI consulted and feel transaminitis likely secondary to alcoholic hepatitis. -Alcohol cessation. -Supportive care.  3.  QTC prolongation -Replete electrolytes, keep magnesium around 2, potassium around 4. -Repeat EKG with resolution of QT prolongation.   -Follow.   4.  Hypokalemia/hypomagnesemia/hypophosphatemia -Repleted.   -Potassium at 3.8, magnesium at 1.4, phosphorus at 3.5.   -Magnesium sulfate 4 g IV x1.   -Repeat labs in the morning.   5.  Alcohol withdrawal syndrome -Patient noted to be agitated, some confusion, likely in alcohol withdrawal. -Alcohol level on admission was 247. -Patient was on the Ativan withdrawal protocol. -Librium detox protocol added to current regimen which patient has completed. -Continue Ativan as needed..   -Continue thiamine to 500 mg IV 3 times daily x4 days, then 250 mg IV 3 times daily x5 days, then thiamine 100 mg daily. -Continue multivitamin, folic acid. -TOC consulted.  6.  Microcytic anemia -Likely dilutional and also secondary to folate deficiency and iron deficiency in menstruating female. -Patient with no overt  bleeding. -Hemoglobin stable at 8.3.  -Anemia panel consistent  with folate deficiency. -Folic acid daily. -Transfusion threshold hemoglobin < 7.  -Follow H&H.  7.  Hypoglycemia -Likely secondary to poor oral intake. -CBG 80 this morning.   -Asymptomatic.   -Follow.  8.  Seizure disorder -Patient noted to be on Keppra prior to admission. -No seizures noted. -Patient received a dose of IV Keppra in the ED. -Continue Keppra 500 mg IV every 12 hours until able to tolerate oral intake adequately and could transition back to home regimen of oral Keppra hopefully in the next 1 to 2 days.   9.  Asthma -Stable.  10.  Peptic ulcer disease -Continue Pepcid  11.  Acute metabolic encephalopathy -Likely secondary to alcohol withdrawal. -Alcohol level within normal limits. -Thiamine level pending.  -More alert today. -Hold off on head imaging for now. -Continue thiamine 500 mg IV 3 times daily x4 days, then thiamine 250 mg IV 3 times daily x5 days, then thiamine 100 mg daily. -Supportive care   DVT prophylaxis: SCDs Code Status: Full Family Communication: No family at bedside. Disposition:   Status is: Inpatient  Remains inpatient appropriate because: Severity of illness       Consultants:  GI: Dr. Therisa Doyne 08/08/2021  Procedures:  CT angiogram chest 08/06/2021 CT abdomen and pelvis 08/06/2021 Chest x-ray 08/06/2021   Antimicrobials: Oral vancomycin 08/08/2021>>>>> IV Rocephin 08/07/2021>>>>> 08/08/2021 IV Flagyl 08/07/2021>>>>>   Subjective: Laying in bed.  Noted ambulating in hallway with therapy.  More alert today.  Tolerating diet.  No significant watery stools or diarrhea.  Slowly improving clinically wise.   Objective: Vitals:   08/11/21 0425 08/11/21 1947 08/12/21 0458 08/12/21 1117  BP:  (!) 143/100 (!) 149/117 (!) 148/119  Pulse: (!) 106 (!) 101 99 (!) 105  Resp:  14 16 20   Temp:  99.1 F (37.3 C) 98.6 F (37 C) 98.3 F (36.8 C)  TempSrc:  Oral  Oral  SpO2: 100% 100% 100% 99%  Weight: 57.9 kg     Height:         Intake/Output Summary (Last 24 hours) at 08/12/2021 1229 Last data filed at 08/12/2021 1224 Gross per 24 hour  Intake 187.16 ml  Output 1475 ml  Net -1287.84 ml    Filed Weights   08/06/21 1415 08/11/21 0425  Weight: 59 kg 57.9 kg    Examination:  General exam: More alert. Respiratory system: CTA B anterior lung fields.  No wheezes, no crackles, no rhonchi.  Normal respiratory effort.  Speaking in full sentences. Cardiovascular system: RRR no murmurs rubs or gallops.  No JVD.  No lower extremity edema.   Gastrointestinal system: Abdomen is soft, nontender, nondistended, positive bowel sounds.  No rebound.  No guarding.  Central nervous system: More alert today.  Moving extremities spontaneously.  No focal neurological deficits.  Extremities: Symmetric 5 x 5 power. Skin: No rashes, lesions or ulcers Psychiatry: Judgement and insight appear poor.. Mood & affect unable to assess.     Data Reviewed: I have personally reviewed following labs and imaging studies  CBC: Recent Labs  Lab 08/07/21 2139 08/08/21 0415 08/09/21 0334 08/10/21 0333 08/11/21 0313 08/12/21 0345  WBC 4.0 5.9 5.1 6.7 7.2 8.6  NEUTROABS 2.4 4.8 3.3 4.4 4.6  --   HGB 8.2* 8.7* 8.5* 8.5* 7.9* 8.3*  HCT 26.4* 27.9* 27.7* 27.3* 25.5* 26.7*  MCV 73.7* 74.0* 74.5* 73.6* 74.6* 74.6*  PLT 162 148* 151 168 152 165     Basic Metabolic  Panel: Recent Labs  Lab 08/08/21 0415 08/09/21 0334 08/10/21 0333 08/11/21 0313 08/12/21 0345  NA 139 136 137 138 136  K 3.8 4.8 4.9 4.3 3.8  CL 106 106 109 109 106  CO2 24 24 24 24 23   GLUCOSE 108* 97 98 82 83  BUN <5* <5* <5* <5* <5*  CREATININE 0.56 0.43* 0.41* 0.56 0.65  CALCIUM 7.0* 7.7* 8.0* 8.2* 8.4*  MG 1.7 1.6* 1.7 2.0 1.4*  PHOS 1.6* 1.9* 2.8  --  3.5     GFR: Estimated Creatinine Clearance: 81.6 mL/min (by C-G formula based on SCr of 0.65 mg/dL).  Liver Function Tests: Recent Labs  Lab 08/07/21 0336 08/08/21 0415 08/09/21 0334 08/10/21 0333  08/11/21 0313 08/12/21 0345  AST 51* 232* 90* 38 29  --   ALT 14 21 20 15 13   --   ALKPHOS 127* 122 136* 126 116  --   BILITOT 1.1 1.5* 1.5* 1.4* 1.3*  --   PROT 5.9* 5.5* 6.2* 6.5 6.1*  --   ALBUMIN 2.3* 2.1* 2.4* 2.6* 2.4* 2.5*     CBG: Recent Labs  Lab 08/11/21 1637 08/11/21 1943 08/11/21 2342 08/12/21 0454 08/12/21 0726  GLUCAP 71 83 111* 74 80      Recent Results (from the past 240 hour(s))  Resp Panel by RT-PCR (Flu A&B, Covid) Nasopharyngeal Swab     Status: None   Collection Time: 08/06/21  2:15 PM   Specimen: Nasopharyngeal Swab; Nasopharyngeal(NP) swabs in vial transport medium  Result Value Ref Range Status   SARS Coronavirus 2 by RT PCR NEGATIVE NEGATIVE Final    Comment: (NOTE) SARS-CoV-2 target nucleic acids are NOT DETECTED.  The SARS-CoV-2 RNA is generally detectable in upper respiratory specimens during the acute phase of infection. The lowest concentration of SARS-CoV-2 viral copies this assay can detect is 138 copies/mL. A negative result does not preclude SARS-Cov-2 infection and should not be used as the sole basis for treatment or other patient management decisions. A negative result may occur with  improper specimen collection/handling, submission of specimen other than nasopharyngeal swab, presence of viral mutation(s) within the areas targeted by this assay, and inadequate number of viral copies(<138 copies/mL). A negative result must be combined with clinical observations, patient history, and epidemiological information. The expected result is Negative.  Fact Sheet for Patients:  08/14/21  Fact Sheet for Healthcare Providers:  13/09/22  This test is no t yet approved or cleared by the [employer] FDA and  has been authorized for detection and/or diagnosis of SARS-CoV-2 by FDA under an Emergency Use Authorization (EUA). This EUA will remain  in effect (meaning this  test can be used) for the duration of the COVID-19 declaration under Section 564(b)(1) of the Act, 21 U.S.C.section 360bbb-3(b)(1), unless the authorization is terminated  or revoked sooner.       Influenza A by PCR NEGATIVE NEGATIVE Final   Influenza B by PCR NEGATIVE NEGATIVE Final    Comment: (NOTE) The Xpert Xpress SARS-CoV-2/FLU/RSV plus assay is intended as an aid in the diagnosis of influenza from Nasopharyngeal swab specimens and should not be used as a sole basis for treatment. Nasal washings and aspirates are unacceptable for Xpert Xpress SARS-CoV-2/FLU/RSV testing.  Fact Sheet for Patients: [employer]  Fact Sheet for Healthcare Providers: [place]  This test is not yet approved or cleared by the [employer] FDA and has been authorized for detection and/or diagnosis of SARS-CoV-2 by FDA under an Emergency Use Authorization (EUA). This EUA  will remain in effect (meaning this test can be used) for the duration of the COVID-19 declaration under Section 564(b)(1) of the Act, 21 U.S.C. section 360bbb-3(b)(1), unless the authorization is terminated or revoked.  Performed at [facility], [address] [place], [place] [address]   Blood culture (routine x 2)     Status: Abnormal   Collection Time: 08/06/21  7:35 PM   Specimen: BLOOD  Result Value Ref Range Status   Specimen Description   Final    BLOOD BLOOD RIGHT ARM Performed at [facility], [address] [place], [place] [address]    Special Requests   Final    BOTTLES DRAWN AEROBIC AND ANAEROBIC Blood Culture adequate volume Performed at [facility], [address] [place], [place] [address]    Culture  Setup Time   Final    GRAM POSITIVE COCCI IN BOTH AEROBIC AND ANAEROBIC BOTTLES CRITICAL RESULT CALLED TO, READ BACK BY AND VERIFIED WITH: PHARMD MICHELLE LILLISTON  08/08/2021 @0026  BY JW    Culture (A)   Final    STAPHYLOCOCCUS EPIDERMIDIS THE SIGNIFICANCE OF ISOLATING THIS ORGANISM FROM A SINGLE VENIPUNCTURE CANNOT BE PREDICTED WITHOUT FURTHER CLINICAL AND CULTURE CORRELATION. SUSCEPTIBILITIES AVAILABLE ONLY ON REQUEST. Performed at [facility], [place] [address] [place], [place] [address]    Report Status 08/09/2021 FINAL  Final  Blood Culture ID Panel (Reflexed)     Status: Abnormal   Collection Time: 08/06/21  7:35 PM  Result Value Ref Range Status   Enterococcus faecalis NOT DETECTED NOT DETECTED Final   Enterococcus Faecium NOT DETECTED NOT DETECTED Final   Listeria monocytogenes NOT DETECTED NOT DETECTED Final   Staphylococcus species DETECTED (A) NOT DETECTED Final    Comment: CRITICAL RESULT CALLED TO, READ BACK BY AND VERIFIED WITH: PHARMD MICHELLE LILLISTON  08/08/2021 @0026  BY JW    Staphylococcus aureus (BCID) NOT DETECTED NOT DETECTED Final   Staphylococcus epidermidis DETECTED (A) NOT DETECTED Final    Comment: CRITICAL RESULT CALLED TO, READ BACK BY AND VERIFIED WITH: PHARMD MICHELLE LILLISTON  08/08/2021 @0026  BY JW    Staphylococcus lugdunensis NOT DETECTED NOT DETECTED Final   Streptococcus species NOT DETECTED NOT DETECTED Final   Streptococcus agalactiae NOT DETECTED NOT DETECTED Final   Streptococcus pneumoniae NOT DETECTED NOT DETECTED Final   Streptococcus pyogenes NOT DETECTED NOT DETECTED Final   A.calcoaceticus-baumannii NOT DETECTED NOT DETECTED Final   Bacteroides fragilis NOT DETECTED NOT DETECTED Final   Enterobacterales NOT DETECTED NOT DETECTED Final   Enterobacter cloacae complex NOT DETECTED NOT DETECTED Final   Escherichia coli NOT DETECTED NOT DETECTED Final   Klebsiella aerogenes NOT DETECTED NOT DETECTED Final   Klebsiella oxytoca NOT DETECTED NOT DETECTED Final   Klebsiella pneumoniae NOT DETECTED NOT DETECTED Final   Proteus species NOT DETECTED NOT DETECTED Final   Salmonella species NOT DETECTED NOT DETECTED Final   Serratia  marcescens NOT DETECTED NOT DETECTED Final   Haemophilus influenzae NOT DETECTED NOT DETECTED Final   Neisseria meningitidis NOT DETECTED NOT DETECTED Final   Pseudomonas aeruginosa NOT DETECTED NOT DETECTED Final   Stenotrophomonas maltophilia NOT DETECTED NOT DETECTED Final   Candida albicans NOT DETECTED NOT DETECTED Final   Candida auris NOT DETECTED NOT DETECTED Final   Candida glabrata NOT DETECTED NOT DETECTED Final   Candida krusei NOT DETECTED NOT DETECTED Final   Candida parapsilosis NOT DETECTED NOT DETECTED Final   Candida tropicalis NOT DETECTED NOT  DETECTED Final   Cryptococcus neoformans/gattii NOT DETECTED NOT DETECTED Final   Methicillin resistance mecA/C NOT DETECTED NOT DETECTED Final    Comment: Performed at [facility], [place] [address], [place], [place] [address]  Blood culture (routine x 2)     Status: None (Preliminary result)   Collection Time: 08/06/21 11:17 PM   Specimen: BLOOD  Result Value Ref Range Status   Specimen Description   Final    BLOOD BLOOD LEFT FOREARM Performed at [facility] [address] [place], [place] [address]    Special Requests   Final    BOTTLES DRAWN AEROBIC ONLY Blood Culture adequate volume Performed at [place] [address], [place], [place] [address]    Culture   Final    NO GROWTH 4 DAYS Performed at [facility], [place] [address] [place], [place] [address]    Report Status PENDING  Incomplete  Gastrointestinal Panel by PCR , Stool     Status: None   Collection Time: 08/08/21  5:23 AM   Specimen: STOOL  Result Value Ref Range Status   Campylobacter species NOT DETECTED NOT DETECTED Final   Plesimonas shigelloides NOT DETECTED NOT DETECTED Final   Salmonella species NOT DETECTED NOT DETECTED Final   Yersinia enterocolitica NOT DETECTED NOT DETECTED Final   Vibrio species NOT DETECTED NOT DETECTED Final   Vibrio cholerae NOT DETECTED NOT DETECTED Final    Enteroaggregative E coli (EAEC) NOT DETECTED NOT DETECTED Final   Enteropathogenic E coli (EPEC) NOT DETECTED NOT DETECTED Final   Enterotoxigenic E coli (ETEC) NOT DETECTED NOT DETECTED Final   Shiga like toxin producing E coli (STEC) NOT DETECTED NOT DETECTED Final   Shigella/Enteroinvasive E coli (EIEC) NOT DETECTED NOT DETECTED Final   Cryptosporidium NOT DETECTED NOT DETECTED Final   Cyclospora cayetanensis NOT DETECTED NOT DETECTED Final   Entamoeba histolytica NOT DETECTED NOT DETECTED Final   Giardia lamblia NOT DETECTED NOT DETECTED Final   Adenovirus F40/41 NOT DETECTED NOT DETECTED Final   Astrovirus NOT DETECTED NOT DETECTED Final   Norovirus GI/GII NOT DETECTED NOT DETECTED Final   Rotavirus A NOT DETECTED NOT DETECTED Final   Sapovirus (I, II, IV, and V) NOT DETECTED NOT DETECTED Final    Comment: Performed at [facility], [address] [place], [place] [address]  C Difficile Quick Screen w PCR reflex     Status: Abnormal   Collection Time: 08/08/21  5:23 AM   Specimen: STOOL  Result Value Ref Range Status   C Diff antigen POSITIVE (A) NEGATIVE Final   C Diff toxin NEGATIVE NEGATIVE Final   C Diff interpretation Results are indeterminate. See PCR results.  Final    Comment: Performed at [facility], [place] [address], [place], [place] [address]  C. Diff by PCR, Reflexed     Status: None   Collection Time: 08/08/21  5:23 AM  Result Value Ref Range Status   Toxigenic C. Difficile by PCR NEGATIVE NEGATIVE Final    Comment: Patient is colonized with non toxigenic C. difficile. May not need treatment unless significant symptoms are present. Performed at [facility], [place] [address], [place], [place] [address]           Radiology Studies: No results found.      Scheduled Meds:  chlorhexidine  15 mL Mouth Rinse BID   Chlorhexidine Gluconate Cloth  6 each Topical Daily   folic acid  1 mg Oral Daily   LORazepam  0-4 mg Oral Q6H    Followed by   Derrill Memo ON 08/13/2021] LORazepam  0-4 mg Oral Q12H   mouth rinse  15 mL Mouth Rinse q12n4p   multivitamin with minerals  1 tablet Oral Daily   nicotine  21 mg Transdermal Daily   sodium chloride flush  10-40 mL Intracatheter Q12H   sucralfate  1 g Oral TID WC & HS   [START ON 08/20/2021] thiamine  100 mg Oral Daily   Or   [START ON 08/20/2021] thiamine  100 mg Intravenous Daily   vancomycin  125 mg Oral QID   Continuous Infusions:  famotidine (PEPCID) IV 20 mg (08/12/21 0829)   levETIRAcetam 500 mg (08/12/21 0539)   metronidazole 500 mg (08/12/21 0947)   [START ON 08/15/2021] thiamine injection     thiamine injection 500 mg (08/12/21 0942)     LOS: 5 days    Time spent: 40 minutes    Irine Seal, MD [facility]   To contact the attending provider between 7A-7P or the covering provider during after hours 7P-7A, please log into the web site [email or web address] and access using universal [place] password for that web site. If you do not have the password, please call the hospital operator.  08/12/2021, 12:29 PM

## 2021-08-12 NOTE — Plan of Care (Signed)
  Problem: Clinical Measurements: Goal: Will remain free from infection Outcome: Progressing Goal: Respiratory complications will improve Outcome: Progressing Goal: Cardiovascular complication will be avoided Outcome: Progressing   Problem: Elimination: Goal: Will not experience complications related to bowel motility Outcome: Progressing Goal: Will not experience complications related to urinary retention Outcome: Progressing   Problem: Education: Goal: Knowledge of General Education information will improve Description: Including pain rating scale, medication(s)/side effects and non-pharmacologic comfort measures Outcome: Not Progressing   Problem: Health Behavior/Discharge Planning: Goal: Ability to manage health-related needs will improve Outcome: Not Progressing   Problem: Clinical Measurements: Goal: Ability to maintain clinical measurements within normal limits will improve Outcome: Not Progressing Goal: Diagnostic test results will improve Outcome: Not Progressing   Problem: Activity: Goal: Risk for activity intolerance will decrease Outcome: Not Progressing   Problem: Nutrition: Goal: Adequate nutrition will be maintained Outcome: Not Progressing   Problem: Coping: Goal: Level of anxiety will decrease Outcome: Not Progressing   Problem: Pain Managment: Goal: General experience of comfort will improve Outcome: Not Progressing   Problem: Safety: Goal: Ability to remain free from injury will improve Outcome: Not Progressing   Problem: Skin Integrity: Goal: Risk for impaired skin integrity will decrease Outcome: Not Progressing   Problem: Safety: Goal: Non-violent Restraint(s) Outcome: Not Progressing

## 2021-08-13 LAB — CBC WITH DIFFERENTIAL/PLATELET
Abs Immature Granulocytes: 0.06 10*3/uL (ref 0.00–0.07)
Basophils Absolute: 0 10*3/uL (ref 0.0–0.1)
Basophils Relative: 1 %
Eosinophils Absolute: 0.1 10*3/uL (ref 0.0–0.5)
Eosinophils Relative: 1 %
HCT: 27.6 % — ABNORMAL LOW (ref 36.0–46.0)
Hemoglobin: 8.4 g/dL — ABNORMAL LOW (ref 12.0–15.0)
Immature Granulocytes: 1 %
Lymphocytes Relative: 16 %
Lymphs Abs: 1.3 10*3/uL (ref 0.7–4.0)
MCH: 22.9 pg — ABNORMAL LOW (ref 26.0–34.0)
MCHC: 30.4 g/dL (ref 30.0–36.0)
MCV: 75.2 fL — ABNORMAL LOW (ref 80.0–100.0)
Monocytes Absolute: 1.6 10*3/uL — ABNORMAL HIGH (ref 0.1–1.0)
Monocytes Relative: 20 %
Neutro Abs: 5.1 10*3/uL (ref 1.7–7.7)
Neutrophils Relative %: 61 %
Platelets: 198 10*3/uL (ref 150–400)
RBC: 3.67 MIL/uL — ABNORMAL LOW (ref 3.87–5.11)
RDW: 22.6 % — ABNORMAL HIGH (ref 11.5–15.5)
WBC: 8.2 10*3/uL (ref 4.0–10.5)
nRBC: 0 % (ref 0.0–0.2)

## 2021-08-13 LAB — BASIC METABOLIC PANEL
Anion gap: 8 (ref 5–15)
BUN: <5 mg/dL — ABNORMAL LOW (ref 6–20)
CO2: 24 mmol/L (ref 22–32)
Calcium: 8.5 mg/dL — ABNORMAL LOW (ref 8.9–10.3)
Chloride: 106 mmol/L (ref 98–111)
Creatinine, Ser: 0.72 mg/dL (ref 0.44–1.00)
GFR, Estimated: >60 mL/min (ref >60)
Glucose, Bld: 81 mg/dL (ref 70–99)
Potassium: 3.4 mmol/L — ABNORMAL LOW (ref 3.5–5.1)
Sodium: 138 mmol/L (ref 135–145)

## 2021-08-13 LAB — GLUCOSE, CAPILLARY
Glucose-Capillary: 102 mg/dL — ABNORMAL HIGH (ref 70–99)
Glucose-Capillary: 113 mg/dL — ABNORMAL HIGH (ref 70–99)
Glucose-Capillary: 75 mg/dL (ref 70–99)
Glucose-Capillary: 84 mg/dL (ref 70–99)
Glucose-Capillary: 95 mg/dL (ref 70–99)
Glucose-Capillary: 97 mg/dL (ref 70–99)

## 2021-08-13 LAB — VITAMIN B1: Vitamin B1 (Thiamine): 209.1 nmol/L — ABNORMAL HIGH (ref 66.5–200.0)

## 2021-08-13 LAB — MAGNESIUM: Magnesium: 2 mg/dL (ref 1.7–2.4)

## 2021-08-13 MED ORDER — POTASSIUM CHLORIDE CRYS ER 20 MEQ PO TBCR
40.0000 meq | EXTENDED_RELEASE_TABLET | Freq: Once | ORAL | Status: AC
  Administered 2021-08-13: 40 meq via ORAL
  Filled 2021-08-13: qty 2

## 2021-08-13 NOTE — Progress Notes (Signed)
Received report from [place], Charity fundraiser. Assuming care of patient at this time. Patient in bed asleep. Restraints present. Assessment to follow.

## 2021-08-13 NOTE — Progress Notes (Signed)
PROGRESS NOTE    Brandi Romero  YSA:[id number] DOB: [birth year] DOA: 08/06/2021 PCP: Pcp, No    Chief Complaint  Patient presents with   Generalized Body Aches    Brief Narrative:   45 year old lady with prior history of asthma, seizures, alcohol and marijuana use, peptic ulcer disease, GI bleed, anemia and colonic polyps presents to ED with 1 week history of shortness of breath, cough chest pain, nausea vomiting and abdominal pain. CT angiogram of the chest is negative for PE CTA abdomen and pelvis shows pancolitis, hepatomegaly, poorly defined uncinate process of the pancreas and nonspecific vague fat stranding along the bowel mesentery. Patient also developed alcohol withdrawal symptoms and was put on Ativan withdrawal protocol.  GI consulted and recommendations given.  Assessment & Plan:   Principal Problem:   Pancolitis (HCC) Active Problems:   Hypokalemia   Hypomagnesemia   QT prolongation   Hypoglycemia   Alcohol abuse   Transaminitis   Alcohol withdrawal (HCC)   Acute metabolic encephalopathy   Pancolitis probably leading to nausea vomiting abdominal pain and diarrhea with significant lactic acidosis from dehydration. UDS positive for THC also contributing to the nausea and vomiting. CT of the abdomen pelvis concerning for pancolitis C. difficile PCR positive for antigen and negative for toxin. Patient was started on IV Flagyl and oral vancomycin. GI consulted recommended stopping the IV Rocephin. Advance diet as tolerated and supportive care.    Transaminitis probably secondary to acute alcohol intoxication. Patient currently in withdrawal symptoms and is on CIWA protocol.    QTC prolongation Improvement in the subsequent EKGs. Keep potassium around 4 and magnesium around 2.   Alcohol withdrawal syndrome On CIWA protocol.    Substance abuse TOC on board for resources.     Microcytic anemia probably secondary to iron and folate  deficiency Supplementation added.   Acute metabolic encephalopathy probably secondary to alcohol withdrawal symptoms?  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  Patient on high-dose of IV thiamine. Patient is alert but still delirious.   Asthma No wheezing heard at this time.    Peptic ulcer disease Stable.    Seizure disorder No seizures noted at this time continue with Keppra.    DVT prophylaxis: (Lovenox) Code Status: (Full code Family Communication: family at bedside.  Disposition:   Status is: Inpatient  Remains inpatient appropriate because:        Consultants:  None.   Procedures: none.  Antimicrobials:  Antibiotics Given (last 72 hours)     Date/Time Action Medication Dose Rate   08/10/21 1718 Given   vancomycin (VANCOCIN) capsule 125 mg 125 mg    08/10/21 2112 Given   vancomycin (VANCOCIN) capsule 125 mg 125 mg    08/10/21 2114 New Bag/Given   metroNIDAZOLE (FLAGYL) IVPB 500 mg 500 mg 100 mL/hr   08/11/21 1009 New Bag/Given   metroNIDAZOLE (FLAGYL) IVPB 500 mg 500 mg 100 mL/hr   08/11/21 1120 Given  [patient refused earlier]   vancomycin (VANCOCIN) capsule 125 mg 125 mg    08/11/21 1637 Given   vancomycin (VANCOCIN) capsule 125 mg 125 mg    08/11/21 1906 Given   vancomycin (VANCOCIN) capsule 125 mg 125 mg    08/11/21 2113 Given   vancomycin (VANCOCIN) capsule 125 mg 125 mg    08/11/21 2247 New Bag/Given   metroNIDAZOLE (FLAGYL) IVPB 500 mg 500 mg 100 mL/hr   08/12/21 3235 Given   vancomycin (VANCOCIN) capsule 125 mg 125 mg    08/12/21 0947 New Bag/Given  metroNIDAZOLE (FLAGYL) IVPB 500 mg 500 mg 100 mL/hr   08/12/21 1706 Given   vancomycin (VANCOCIN) capsule 125 mg 125 mg    08/12/21 2259 Given   vancomycin (VANCOCIN) capsule 125 mg 125 mg    08/12/21 2300 New Bag/Given   metroNIDAZOLE (FLAGYL) IVPB 500 mg 500 mg 100 mL/hr   08/13/21 0810 New Bag/Given   metroNIDAZOLE (FLAGYL) IVPB 500 mg 500 mg 100 mL/hr   08/13/21 1139  Given   vancomycin (VANCOCIN) capsule 125 mg 125 mg    08/13/21 1455 Given   vancomycin (VANCOCIN) capsule 125 mg 125 mg          Subjective: Pt alert but still very confused.   Objective: Vitals:   08/12/21 1117 08/12/21 1951 08/13/21 0433 08/13/21 0500  BP: (!) 148/119 (!) 135/98 (!) 147/100   Pulse: (!) 105 (!) 102 (!) 104   Resp: 20 16 16    Temp: 98.3 F (36.8 C) 98.7 F (37.1 C) 98 F (36.7 C)   TempSrc: Oral Oral Oral   SpO2: 99% 97% 100%   Weight:    66.5 kg  Height:        Intake/Output Summary (Last 24 hours) at 08/13/2021 1443 Last data filed at 08/13/2021 0711 Gross per 24 hour  Intake 576.56 ml  Output 400 ml  Net 176.56 ml   Filed Weights   08/06/21 1415 08/11/21 0425 08/13/21 0500  Weight: 59 kg 57.9 kg 66.5 kg    Examination:  General exam: Appears calm and comfortable  Respiratory system: Clear to auscultation. Respiratory effort normal. On [place] oxygen.  Cardiovascular system: S1 & S2 heard, RRR. No JVD,  No pedal edema. Gastrointestinal system: Abdomen is nondistended, soft and nontender. . Normal bowel sounds heard. Central nervous system: Alert and oriented to person only.  Extremities: Symmetric 5 x 5 power. Skin: No rashes, lesions or ulcers Psychiatry:  unable to assess due to confusion.     Data Reviewed: I have personally reviewed following labs and imaging studies  CBC: Recent Labs  Lab 08/08/21 0415 08/09/21 0334 08/10/21 0333 08/11/21 0313 08/12/21 0345 08/13/21 0453  WBC 5.9 5.1 6.7 7.2 8.6 8.2  NEUTROABS 4.8 3.3 4.4 4.6  --  5.1  HGB 8.7* 8.5* 8.5* 7.9* 8.3* 8.4*  HCT 27.9* 27.7* 27.3* 25.5* 26.7* 27.6*  MCV 74.0* 74.5* 73.6* 74.6* 74.6* 75.2*  PLT 148* 151 168 152 165 198    Basic Metabolic Panel: Recent Labs  Lab 08/08/21 0415 08/09/21 0334 08/10/21 0333 08/11/21 0313 08/12/21 0345 08/13/21 0453  NA 139 136 137 138 136 138  K 3.8 4.8 4.9 4.3 3.8 3.4*  CL 106 106 109 109 106 106  CO2 24 24 24 24 23 24    GLUCOSE 108* 97 98 82 83 81  BUN <5* <5* <5* <5* <5* <5*  CREATININE 0.56 0.43* 0.41* 0.56 0.65 0.72  CALCIUM 7.0* 7.7* 8.0* 8.2* 8.4* 8.5*  MG 1.7 1.6* 1.7 2.0 1.4* 2.0  PHOS 1.6* 1.9* 2.8  --  3.5  --     GFR: Estimated Creatinine Clearance: 81.6 mL/min (by C-G formula based on SCr of 0.72 mg/dL).  Liver Function Tests: Recent Labs  Lab 08/07/21 0336 08/08/21 0415 08/09/21 0334 08/10/21 0333 08/11/21 0313 08/12/21 0345  AST 51* 232* 90* 38 29  --   ALT 14 21 20 15 13   --   ALKPHOS 127* 122 136* 126 116  --   BILITOT 1.1 1.5* 1.5* 1.4* 1.3*  --   PROT  5.9* 5.5* 6.2* 6.5 6.1*  --   ALBUMIN 2.3* 2.1* 2.4* 2.6* 2.4* 2.5*    CBG: Recent Labs  Lab 08/12/21 1945 08/13/21 0035 08/13/21 0425 08/13/21 0738 08/13/21 1147  GLUCAP 89 113* 84 75 102*     Recent Results (from the past 240 hour(s))  Resp Panel by RT-PCR (Flu A&B, Covid) Nasopharyngeal Swab     Status: None   Collection Time: 08/06/21  2:15 PM   Specimen: Nasopharyngeal Swab; Nasopharyngeal(NP) swabs in vial transport medium  Result Value Ref Range Status   SARS Coronavirus 2 by RT PCR NEGATIVE NEGATIVE Final    Comment: (NOTE) SARS-CoV-2 target nucleic acids are NOT DETECTED.  The SARS-CoV-2 RNA is generally detectable in upper respiratory specimens during the acute phase of infection. The lowest concentration of SARS-CoV-2 viral copies this assay can detect is 138 copies/mL. A negative result does not preclude SARS-Cov-2 infection and should not be used as the sole basis for treatment or other patient management decisions. A negative result may occur with  improper specimen collection/handling, submission of specimen other than nasopharyngeal swab, presence of viral mutation(s) within the areas targeted by this assay, and inadequate number of viral copies(<138 copies/mL). A negative result must be combined with clinical observations, patient history, and epidemiological information. The expected  result is Negative.  Fact Sheet for Patients:  BloggerCourse.com  Fact Sheet for Healthcare Providers:  SeriousBroker.it  This test is no t yet approved or cleared by the [place] FDA and  has been authorized for detection and/or diagnosis of SARS-CoV-2 by FDA under an Emergency Use Authorization (EUA). This EUA will remain  in effect (meaning this test can be used) for the duration of the COVID-19 declaration under Section 564(b)(1) of the Act, 21 U.S.C.section 360bbb-3(b)(1), unless the authorization is terminated  or revoked sooner.       Influenza A by PCR NEGATIVE NEGATIVE Final   Influenza B by PCR NEGATIVE NEGATIVE Final    Comment: (NOTE) The Xpert Xpress SARS-CoV-2/FLU/RSV plus assay is intended as an aid in the diagnosis of influenza from Nasopharyngeal swab specimens and should not be used as a sole basis for treatment. Nasal washings and aspirates are unacceptable for Xpert Xpress SARS-CoV-2/FLU/RSV testing.  Fact Sheet for Patients: BloggerCourse.com  Fact Sheet for Healthcare Providers: SeriousBroker.it  This test is not yet approved or cleared by the [place] FDA and has been authorized for detection and/or diagnosis of SARS-CoV-2 by FDA under an Emergency Use Authorization (EUA). This EUA will remain in effect (meaning this test can be used) for the duration of the COVID-19 declaration under Section 564(b)(1) of the Act, 21 U.S.C. section 360bbb-3(b)(1), unless the authorization is terminated or revoked.  Performed at [facility], [address] [place], [place] [address]   Blood culture (routine x 2)     Status: Abnormal   Collection Time: 08/06/21  7:35 PM   Specimen: BLOOD  Result Value Ref Range Status   Specimen Description   Final    BLOOD BLOOD RIGHT ARM Performed at [facility], [address] [place],  [place] [address]    Special Requests   Final    BOTTLES DRAWN AEROBIC AND ANAEROBIC Blood Culture adequate volume Performed at [facility], [address] [place], [place] [address]    Culture  Setup Time   Final    GRAM POSITIVE COCCI IN BOTH AEROBIC AND ANAEROBIC BOTTLES CRITICAL RESULT CALLED TO, READ BACK  BY AND VERIFIED WITH: PHARMD MICHELLE LILLISTON  08/08/2021 @0026  BY JW    Culture (A)  Final    STAPHYLOCOCCUS EPIDERMIDIS THE SIGNIFICANCE OF ISOLATING THIS ORGANISM FROM A SINGLE VENIPUNCTURE CANNOT BE PREDICTED WITHOUT FURTHER CLINICAL AND CULTURE CORRELATION. SUSCEPTIBILITIES AVAILABLE ONLY ON REQUEST. Performed at [facility], [address] [place], [place]    Report Status 08/09/2021 FINAL  Final  Blood Culture ID Panel (Reflexed)     Status: Abnormal   Collection Time: 08/06/21  7:35 PM  Result Value Ref Range Status   Enterococcus faecalis NOT DETECTED NOT DETECTED Final   Enterococcus Faecium NOT DETECTED NOT DETECTED Final   Listeria monocytogenes NOT DETECTED NOT DETECTED Final   Staphylococcus species DETECTED (A) NOT DETECTED Final    Comment: CRITICAL RESULT CALLED TO, READ BACK BY AND VERIFIED WITH: PHARMD MICHELLE LILLISTON  08/08/2021 @0026  BY JW    Staphylococcus aureus (BCID) NOT DETECTED NOT DETECTED Final   Staphylococcus epidermidis DETECTED (A) NOT DETECTED Final    Comment: CRITICAL RESULT CALLED TO, READ BACK BY AND VERIFIED WITH: PHARMD MICHELLE LILLISTON  08/08/2021 @0026  BY JW    Staphylococcus lugdunensis NOT DETECTED NOT DETECTED Final   Streptococcus species NOT DETECTED NOT DETECTED Final   Streptococcus agalactiae NOT DETECTED NOT DETECTED Final   Streptococcus pneumoniae NOT DETECTED NOT DETECTED Final   Streptococcus pyogenes NOT DETECTED NOT DETECTED Final   A.calcoaceticus-baumannii NOT DETECTED NOT DETECTED Final   Bacteroides fragilis NOT DETECTED NOT DETECTED Final   Enterobacterales NOT DETECTED NOT DETECTED  Final   Enterobacter cloacae complex NOT DETECTED NOT DETECTED Final   Escherichia coli NOT DETECTED NOT DETECTED Final   Klebsiella aerogenes NOT DETECTED NOT DETECTED Final   Klebsiella oxytoca NOT DETECTED NOT DETECTED Final   Klebsiella pneumoniae NOT DETECTED NOT DETECTED Final   Proteus species NOT DETECTED NOT DETECTED Final   Salmonella species NOT DETECTED NOT DETECTED Final   Serratia marcescens NOT DETECTED NOT DETECTED Final   Haemophilus influenzae NOT DETECTED NOT DETECTED Final   Neisseria meningitidis NOT DETECTED NOT DETECTED Final   Pseudomonas aeruginosa NOT DETECTED NOT DETECTED Final   Stenotrophomonas maltophilia NOT DETECTED NOT DETECTED Final   Candida albicans NOT DETECTED NOT DETECTED Final   Candida auris NOT DETECTED NOT DETECTED Final   Candida glabrata NOT DETECTED NOT DETECTED Final   Candida krusei NOT DETECTED NOT DETECTED Final   Candida parapsilosis NOT DETECTED NOT DETECTED Final   Candida tropicalis NOT DETECTED NOT DETECTED Final   Cryptococcus neoformans/gattii NOT DETECTED NOT DETECTED Final   Methicillin resistance mecA/C NOT DETECTED NOT DETECTED Final    Comment: Performed at [facility], [address] [place], [facility] [address]  Blood culture (routine x 2)     Status: None   Collection Time: 08/06/21 11:17 PM   Specimen: BLOOD  Result Value Ref Range Status   Specimen Description   Final    BLOOD BLOOD LEFT FOREARM Performed at [facility], [address], [place], [place]    Special Requests   Final    BOTTLES DRAWN AEROBIC ONLY Blood Culture adequate volume Performed at [facility], [address] [place], [place]    Culture   Final    NO GROWTH 5 DAYS Performed at [facility], [address], [place], [facility] [address]    Report Status 08/12/2021 FINAL  Final  Gastrointestinal Panel by PCR , Stool  Status: None   Collection Time: 08/08/21  5:23 AM    Specimen: STOOL  Result Value Ref Range Status   Campylobacter species NOT DETECTED NOT DETECTED Final   Plesimonas shigelloides NOT DETECTED NOT DETECTED Final   Salmonella species NOT DETECTED NOT DETECTED Final   Yersinia enterocolitica NOT DETECTED NOT DETECTED Final   Vibrio species NOT DETECTED NOT DETECTED Final   Vibrio cholerae NOT DETECTED NOT DETECTED Final   Enteroaggregative E coli (EAEC) NOT DETECTED NOT DETECTED Final   Enteropathogenic E coli (EPEC) NOT DETECTED NOT DETECTED Final   Enterotoxigenic E coli (ETEC) NOT DETECTED NOT DETECTED Final   Shiga like toxin producing E coli (STEC) NOT DETECTED NOT DETECTED Final   Shigella/Enteroinvasive E coli (EIEC) NOT DETECTED NOT DETECTED Final   Cryptosporidium NOT DETECTED NOT DETECTED Final   Cyclospora cayetanensis NOT DETECTED NOT DETECTED Final   Entamoeba histolytica NOT DETECTED NOT DETECTED Final   Giardia lamblia NOT DETECTED NOT DETECTED Final   Adenovirus F40/41 NOT DETECTED NOT DETECTED Final   Astrovirus NOT DETECTED NOT DETECTED Final   Norovirus GI/GII NOT DETECTED NOT DETECTED Final   Rotavirus A NOT DETECTED NOT DETECTED Final   Sapovirus (I, II, IV, and V) NOT DETECTED NOT DETECTED Final    Comment: Performed at [facility], [address] [place], [place] [address]  C Difficile Quick Screen w PCR reflex     Status: Abnormal   Collection Time: 08/08/21  5:23 AM   Specimen: STOOL  Result Value Ref Range Status   C Diff antigen POSITIVE (A) NEGATIVE Final   C Diff toxin NEGATIVE NEGATIVE Final   C Diff interpretation Results are indeterminate. See PCR results.  Final    Comment: Performed at [facility], [address], [place], [place] [address]  C. Diff by PCR, Reflexed     Status: None   Collection Time: 08/08/21  5:23 AM  Result Value Ref Range Status   Toxigenic C. Difficile by PCR NEGATIVE NEGATIVE Final    Comment: Patient is colonized with non toxigenic C. difficile.  May not need treatment unless significant symptoms are present. Performed at [facility], [address], [place], [place] [address]          Radiology Studies: No results found.      Scheduled Meds:  chlorhexidine  15 mL Mouth Rinse BID   Chlorhexidine Gluconate Cloth  6 each Topical Daily   folic acid  1 mg Oral Daily   LORazepam  0-4 mg Oral Q12H   mouth rinse  15 mL Mouth Rinse q12n4p   multivitamin with minerals  1 tablet Oral Daily   nicotine  21 mg Transdermal Daily   sodium chloride flush  10-40 mL Intracatheter Q12H   sucralfate  1 g Oral TID WC & HS   [START ON 08/20/2021] thiamine  100 mg Oral Daily   Or   [START ON 08/20/2021] thiamine  100 mg Intravenous Daily   vancomycin  125 mg Oral QID   Continuous Infusions:  famotidine (PEPCID) IV 20 mg (08/13/21 1023)   levETIRAcetam 500 mg (08/13/21 0516)   metronidazole 500 mg (08/13/21 0810)   [START ON 08/15/2021] thiamine injection     thiamine injection 500 mg (08/13/21 1027)     LOS: 6 days        Kathlen Mody, MD [facility]   To contact the attending provider between 7A-7P or the covering provider during after hours 7P-7A, please  log into the web site [email or web address] and access using universal [place] password for that web site. If you do not have the password, please call the hospital operator.  08/13/2021, 2:43 PM

## 2021-08-14 LAB — GLUCOSE, CAPILLARY
Glucose-Capillary: 85 mg/dL (ref 70–99)
Glucose-Capillary: 76 mg/dL (ref 70–99)
Glucose-Capillary: 107 mg/dL — ABNORMAL HIGH (ref 70–99)
Glucose-Capillary: 89 mg/dL (ref 70–99)

## 2021-08-14 MED ORDER — HALOPERIDOL 1 MG PO TABS
1.0000 mg | ORAL_TABLET | Freq: Four times a day (QID) | ORAL | Status: DC | PRN
  Administered 2021-08-14 – 2021-08-15 (×3): 1 mg via ORAL
  Filled 2021-08-14 (×5): qty 1

## 2021-08-14 MED ORDER — FAMOTIDINE 20 MG PO TABS
20.0000 mg | ORAL_TABLET | Freq: Two times a day (BID) | ORAL | Status: DC
  Administered 2021-08-14 – 2021-08-16 (×4): 20 mg via ORAL
  Filled 2021-08-14 (×4): qty 1

## 2021-08-14 MED ORDER — LEVETIRACETAM 500 MG PO TABS
500.0000 mg | ORAL_TABLET | Freq: Two times a day (BID) | ORAL | Status: DC
  Administered 2021-08-14 – 2021-08-16 (×4): 500 mg via ORAL
  Filled 2021-08-14 (×4): qty 1

## 2021-08-14 NOTE — TOC Progression Note (Signed)
Transition of Care [facility]) - Progression Note    Patient Details  Name: Brandi Romero MRN: [id number] Date of Birth: 11/03/1975  Transition of [facility]) CM/SW Contact  Golda Acre, RN Phone Number: 08/14/2021, 7:50 AM  Clinical Narrative:    Principal Problem:   Pancolitis (HCC) Active Problems:   Hypokalemia   Hypomagnesemia   QT prolongation   Hypoglycemia   Alcohol abuse   Transaminitis   Alcohol withdrawal (HCC)   Acute metabolic encephalopathy     Pancolitis probably leading to nausea vomiting abdominal pain and diarrhea with significant lactic acidosis from dehydration. UDS positive for THC also contributing to the nausea and vomiting. CT of the abdomen pelvis concerning for pancolitis C. difficile PCR positive for antigen and negative for toxin. Patient was started on IV Flagyl and oral vancomycin. GI consulted recommended stopping the IV Rocephin. Advance diet as tolerated and supportive care.  TOC PLAN OF CARE: Snf once out of restraints x 24 hours. Expected Discharge Plan: Skilled Nursing Facility Barriers to Discharge: Continued Medical Work up  Expected Discharge Plan and Services Expected Discharge Plan: Skilled Nursing Facility   Discharge Planning Services: CM Consult Post Acute Care Choice: Skilled Nursing Facility Living arrangements for the past 2 months: Single Family Home                                       Social Determinants of Health (SDOH) Interventions    Readmission Risk Interventions No flowsheet data found.

## 2021-08-14 NOTE — Progress Notes (Signed)
Physical Therapy Treatment Patient Details Name: Brandi Romero MRN: [id number] DOB: 08-13-1976 Today's Date: 08/14/2021   History of Present Illness 45 year old female history of asthma, seizure disorder, alcohol use, marijuana use, PUD, GI bleed, anemia, colonic polyps presenting to the ED with one 1 week history of flulike symptoms with body aches, cough, shortness of breath, chest pain, nausea vomiting generalized abdominal pain.  Patient noted to be agitated confused likely in alcohol withdrawal and placed on Ativan withdrawal protocol.  Librium detox protocol added to regiment.  Patient also noted to have QTC prolongation electrolyte abnormalities.  Due to worsening transaminitis and pancolitis GI consulted for further evaluation and management.    PT Comments    PT alerted by RN that patient is awake and ready to walk. + 1 HHA for duration due to gait instability, veers to each side, decreased awareness of environment, trying to punch in numbers on a phone while ambulating. Patient non verbal. Continue PT for mobility.   Recommendations for follow up therapy are one component of a multi-disciplinary discharge planning process, led by the attending physician.  Recommendations may be updated based on patient status, additional functional criteria and insurance authorization.  Follow Up Recommendations  Skilled nursing-short term rehab (<3 hours/day)     Assistance Recommended at Discharge Frequent or constant Supervision/Assistance  Equipment Recommendations  None recommended by PT    Recommendations for Other Services       Precautions / Restrictions Precautions Precautions: Fall     Mobility  Bed Mobility               General bed mobility comments: standing up with RN steadying    Transfers Overall transfer level: Needs assistance   Transfers: Sit to/from Stand Sit to Stand: Min assist           General transfer comment: steady assist for balance, impulsive  and somewhat ballistic, dyscontrol of trunk and limbs    Ambulation/Gait Ambulation/Gait assistance: Min assist;+2 safety/equipment Gait Distance (Feet): 400 Feet Assistive device: 1 person hand held assist Gait Pattern/deviations: Step-through pattern;Staggering right;Drifts right/left;Staggering left Gait velocity: decreased     General Gait Details: patient swaying, stumbles, at times veers to left and right, steady assistance for balance.   Stairs             Wheelchair Mobility    Modified Rankin (Stroke Patients Only)       Balance Overall balance assessment: Needs assistance Sitting-balance support: Feet supported;No upper extremity supported Sitting balance-Leahy Scale: Fair Sitting balance - Comments: when alert   Standing balance support: During functional activity;Single extremity supported Standing balance-Leahy Scale: Poor Standing balance comment: balance with min to mod A and UE support; required support for balance loss                            Cognition Arousal/Alertness: Awake/alert Behavior During Therapy: Impulsive;Flat affect Overall Cognitive Status: Impaired/Different from baseline Area of Impairment: Following commands;Safety/judgement;Attention;Problem solving;Orientation                   Current Attention Level: Sustained   Following Commands: Follows one step commands inconsistently Safety/Judgement: Decreased awareness of deficits;Decreased awareness of safety   Problem Solving: Slow processing;Difficulty sequencing;Requires verbal cues;Requires tactile cues General Comments: patient did follow  instructions to ambulate with RN and therapist. patient holding RN phone, not  using correctly        Exercises      General Comments  Pertinent Vitals/Pain Faces Pain Scale: No hurt    Home Living                          Prior Function            PT Goals (current goals can now be  found in the care plan section) Progress towards PT goals: Progressing toward goals    Frequency    Min 2X/week      PT Plan Current plan remains appropriate;Frequency needs to be updated    Co-evaluation              AM-PAC PT "6 Clicks" Mobility   Outcome Measure  Help needed turning from your back to your side while in a flat bed without using bedrails?: A Little Help needed moving from lying on your back to sitting on the side of a flat bed without using bedrails?: A Little Help needed moving to and from a bed to a chair (including a wheelchair)?: A Little Help needed standing up from a chair using your arms (e.g., wheelchair or bedside chair)?: A Little Help needed to walk in hospital room?: A Lot Help needed climbing 3-5 steps with a railing? : Total 6 Click Score: 15    End of Session Equipment Utilized During Treatment: Gait belt Activity Tolerance: Patient tolerated treatment well Patient left: in bed;with nursing/sitter in room Nurse Communication: Mobility status PT Visit Diagnosis: Difficulty in walking, not elsewhere classified (R26.2);Other symptoms and signs involving the nervous system (R29.898);Other abnormalities of gait and mobility (R26.89)     Time: 1191-4782 PT Time Calculation (min) (ACUTE ONLY): 17 min  Charges:  $Gait Training: 8-22 mins                     Blanchard Kelch PT Acute Rehabilitation Services Pager [phone] Office [phone]    Rada Hay 08/14/2021, 2:54 PM

## 2021-08-14 NOTE — Progress Notes (Signed)
Patient awaken from sleep and asked writer for cigarette or marijuana and is very addiment about leaving. Patient currently agreed to sit in recliner as long as Clinical research associate allowed her to use the hospital phone. Patient intermittently tries to pull out PICC line but is easily redirected. Will closely monitor for next 2 hours.

## 2021-08-14 NOTE — Progress Notes (Signed)
PROGRESS NOTE    Brandi Romero  WGN:[id number] DOB: 04-04-1976 DOA: 08/06/2021 PCP: Pcp, No    Chief Complaint  Patient presents with   Generalized Body Aches    Brief Narrative:   45 year old lady with prior history of asthma, seizures, alcohol and marijuana use, peptic ulcer disease, GI bleed, anemia and colonic polyps presents to ED with 1 week history of shortness of breath, cough chest pain, nausea vomiting and abdominal pain. CT angiogram of the chest is negative for PE CTA abdomen and pelvis shows pancolitis, hepatomegaly, poorly defined uncinate process of the pancreas and nonspecific vague fat stranding along the bowel mesentery. Patient also developed alcohol withdrawal symptoms and was put on Ativan withdrawal protocol.  GI consulted and recommendations given.  Assessment & Plan:   Principal Problem:   Pancolitis (HCC) Active Problems:   Hypokalemia   Hypomagnesemia   QT prolongation   Hypoglycemia   Alcohol abuse   Transaminitis   Alcohol withdrawal (HCC)   Acute metabolic encephalopathy   Pancolitis probably leading to nausea vomiting abdominal pain and diarrhea with significant lactic acidosis from dehydration. UDS positive for THC also contributing to the nausea and vomiting. CT of the abdomen pelvis concerning for pancolitis C. difficile PCR positive for antigen and negative for toxin. Patient was started on IV Flagyl and oral vancomycin. GI consulted recommended stopping the IV Rocephin. Advance diet as tolerated and supportive care. No diarrhea, no nausea vomiting or abdominal pain at this time.  Continue to monitor    Transaminitis probably secondary to acute alcohol intoxication. Patient currently in withdrawal symptoms and is on CIWA protocol.    QTC prolongation Improvement in the subsequent EKGs. Keep potassium around 4 and magnesium around 2.   Alcohol withdrawal syndrome No withdrawal symptoms at this time.    Substance abuse TOC  on board for resources.     Microcytic anemia probably secondary to iron and folate deficiency Supplementation added.   Acute metabolic encephalopathy probably secondary to alcohol withdrawal symptoms?  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  Patient on high-dose of IV thiamine. Patient was given a dose of IV Ativan earlier this morning and has been sleeping since then. She will have completed 5 doses of IV thiamine today    Asthma no wheezing heard, continue with bronchodilators.    Peptic ulcer disease Stable.    Seizure disorder No seizures noted at this time continue with Keppra. Transition to oral Keppra today     DVT prophylaxis: (Lovenox) Code Status: (Full code Family Communication: family at bedside.  Disposition:   Status is: Inpatient  Remains inpatient appropriate because: SNF, unsafe DC       Consultants:  None.   Procedures: none.  Antimicrobials:  Antibiotics Given (last 72 hours)     Date/Time Action Medication Dose Rate   08/11/21 1637 Given   vancomycin (VANCOCIN) capsule 125 mg 125 mg    08/11/21 1906 Given   vancomycin (VANCOCIN) capsule 125 mg 125 mg    08/11/21 2113 Given   vancomycin (VANCOCIN) capsule 125 mg 125 mg    08/11/21 2247 New Bag/Given   metroNIDAZOLE (FLAGYL) IVPB 500 mg 500 mg 100 mL/hr   08/12/21 7846 Given   vancomycin (VANCOCIN) capsule 125 mg 125 mg    08/12/21 0947 New Bag/Given   metroNIDAZOLE (FLAGYL) IVPB 500 mg 500 mg 100 mL/hr   08/12/21 1706 Given   vancomycin (VANCOCIN) capsule 125 mg 125 mg    08/12/21 2259 Given   vancomycin (VANCOCIN) capsule  125 mg 125 mg    08/12/21 2300 New Bag/Given   metroNIDAZOLE (FLAGYL) IVPB 500 mg 500 mg 100 mL/hr   08/13/21 0810 New Bag/Given   metroNIDAZOLE (FLAGYL) IVPB 500 mg 500 mg 100 mL/hr   08/13/21 1139 Given   vancomycin (VANCOCIN) capsule 125 mg 125 mg    08/13/21 1623 Given  [patient sleeping]   vancomycin (VANCOCIN) capsule 125 mg 125 mg     08/13/21 1827 Given   vancomycin (VANCOCIN) capsule 125 mg 125 mg    08/13/21 2117 New Bag/Given   metroNIDAZOLE (FLAGYL) IVPB 500 mg 500 mg 100 mL/hr   08/13/21 2117 Given   vancomycin (VANCOCIN) capsule 125 mg 125 mg          Subjective: Somnolent from ativan.   Objective: Vitals:   08/13/21 0500 08/13/21 2001 08/14/21 0422 08/14/21 0634  BP:  119/81  (!) 141/94  Pulse:  (!) 106  (!) 102  Resp:  16  16  Temp:  98.5 F (36.9 C)  98.5 F (36.9 C)  TempSrc:  Oral    SpO2:  96%  100%  Weight: 66.5 kg  66.7 kg   Height:        Intake/Output Summary (Last 24 hours) at 08/14/2021 1155 Last data filed at 08/14/2021 0636 Gross per 24 hour  Intake 800 ml  Output --  Net 800 ml    Filed Weights   08/11/21 0425 08/13/21 0500 08/14/21 0422  Weight: 57.9 kg 66.5 kg 66.7 kg    Examination:  General exam: Sleeping comfortably not in any kind of distress Respiratory system: Clear to auscultation. Respiratory effort normal. Cardiovascular system: S1 & S2 heard, RRR. No JVD, murmurs, rubs, gallops or clicks. No pedal edema. Gastrointestinal system: Abdomen is nondistended, soft and nontender. Normal bowel sounds heard. Central nervous system: Somnolent, opens eyes on verbal cues and goes back to bed Extremities: Symmetric 5 x 5 power. Skin: No rashes, lesions or ulcers Psychiatry: Unable to assess due to somnolence     Data Reviewed: I have personally reviewed following labs and imaging studies  CBC: Recent Labs  Lab 08/08/21 0415 08/09/21 0334 08/10/21 0333 08/11/21 0313 08/12/21 0345 08/13/21 0453  WBC 5.9 5.1 6.7 7.2 8.6 8.2  NEUTROABS 4.8 3.3 4.4 4.6  --  5.1  HGB 8.7* 8.5* 8.5* 7.9* 8.3* 8.4*  HCT 27.9* 27.7* 27.3* 25.5* 26.7* 27.6*  MCV 74.0* 74.5* 73.6* 74.6* 74.6* 75.2*  PLT 148* 151 168 152 165 198     Basic Metabolic Panel: Recent Labs  Lab 08/08/21 0415 08/09/21 0334 08/10/21 0333 08/11/21 0313 08/12/21 0345 08/13/21 0453  NA 139 136 137  138 136 138  K 3.8 4.8 4.9 4.3 3.8 3.4*  CL 106 106 109 109 106 106  CO2 24 24 24 24 23 24   GLUCOSE 108* 97 98 82 83 81  BUN <5* <5* <5* <5* <5* <5*  CREATININE 0.56 0.43* 0.41* 0.56 0.65 0.72  CALCIUM 7.0* 7.7* 8.0* 8.2* 8.4* 8.5*  MG 1.7 1.6* 1.7 2.0 1.4* 2.0  PHOS 1.6* 1.9* 2.8  --  3.5  --      GFR: Estimated Creatinine Clearance: 81.6 mL/min (by C-G formula based on SCr of 0.72 mg/dL).  Liver Function Tests: Recent Labs  Lab 08/08/21 0415 08/09/21 0334 08/10/21 0333 08/11/21 0313 08/12/21 0345  AST 232* 90* 38 29  --   ALT 21 20 15 13   --   ALKPHOS 122 136* 126 116  --   BILITOT  1.5* 1.5* 1.4* 1.3*  --   PROT 5.5* 6.2* 6.5 6.1*  --   ALBUMIN 2.1* 2.4* 2.6* 2.4* 2.5*     CBG: Recent Labs  Lab 08/13/21 1147 08/13/21 1635 08/13/21 2138 08/14/21 0743 08/14/21 1153  GLUCAP 102* 97 95 85 76      Recent Results (from the past 240 hour(s))  Resp Panel by RT-PCR (Flu A&B, Covid) Nasopharyngeal Swab     Status: None   Collection Time: 08/06/21  2:15 PM   Specimen: Nasopharyngeal Swab; Nasopharyngeal(NP) swabs in vial transport medium  Result Value Ref Range Status   SARS Coronavirus 2 by RT PCR NEGATIVE NEGATIVE Final    Comment: (NOTE) SARS-CoV-2 target nucleic acids are NOT DETECTED.  The SARS-CoV-2 RNA is generally detectable in upper respiratory specimens during the acute phase of infection. The lowest concentration of SARS-CoV-2 viral copies this assay can detect is 138 copies/mL. A negative result does not preclude SARS-Cov-2 infection and should not be used as the sole basis for treatment or other patient management decisions. A negative result may occur with  improper specimen collection/handling, submission of specimen other than nasopharyngeal swab, presence of viral mutation(s) within the areas targeted by this assay, and inadequate number of viral copies(<138 copies/mL). A negative result must be combined with clinical observations, patient  history, and epidemiological information. The expected result is Negative.  Fact Sheet for Patients:  BloggerCourse.com  Fact Sheet for Healthcare Providers:  SeriousBroker.it  This test is no t yet approved or cleared by the [place] FDA and  has been authorized for detection and/or diagnosis of SARS-CoV-2 by FDA under an Emergency Use Authorization (EUA). This EUA will remain  in effect (meaning this test can be used) for the duration of the COVID-19 declaration under Section 564(b)(1) of the Act, 21 U.S.C.section 360bbb-3(b)(1), unless the authorization is terminated  or revoked sooner.       Influenza A by PCR NEGATIVE NEGATIVE Final   Influenza B by PCR NEGATIVE NEGATIVE Final    Comment: (NOTE) The Xpert Xpress SARS-CoV-2/FLU/RSV plus assay is intended as an aid in the diagnosis of influenza from Nasopharyngeal swab specimens and should not be used as a sole basis for treatment. Nasal washings and aspirates are unacceptable for Xpert Xpress SARS-CoV-2/FLU/RSV testing.  Fact Sheet for Patients: BloggerCourse.com  Fact Sheet for Healthcare Providers: SeriousBroker.it  This test is not yet approved or cleared by the [place] FDA and has been authorized for detection and/or diagnosis of SARS-CoV-2 by FDA under an Emergency Use Authorization (EUA). This EUA will remain in effect (meaning this test can be used) for the duration of the COVID-19 declaration under Section 564(b)(1) of the Act, 21 U.S.C. section 360bbb-3(b)(1), unless the authorization is terminated or revoked.  Performed at [facility], [address] [place], [place] [address]   Blood culture (routine x 2)     Status: Abnormal   Collection Time: 08/06/21  7:35 PM   Specimen: BLOOD  Result Value Ref Range Status   Specimen Description   Final    BLOOD BLOOD RIGHT ARM Performed at [facility], [address] [place], [place] [address]    Special Requests   Final    BOTTLES DRAWN AEROBIC AND ANAEROBIC Blood Culture adequate volume Performed at [facility], [address] [place], [place] [address]    Culture  Setup Time   Final    GRAM POSITIVE COCCI IN BOTH AEROBIC  AND ANAEROBIC BOTTLES CRITICAL RESULT CALLED TO, READ BACK BY AND VERIFIED WITH: PHARMD MICHELLE LILLISTON  08/08/2021 @0026  BY JW    Culture (A)  Final    STAPHYLOCOCCUS EPIDERMIDIS THE SIGNIFICANCE OF ISOLATING THIS ORGANISM FROM A SINGLE VENIPUNCTURE CANNOT BE PREDICTED WITHOUT FURTHER CLINICAL AND CULTURE CORRELATION. SUSCEPTIBILITIES AVAILABLE ONLY ON REQUEST. Performed at [facility], [address], [place], [place] [address]    Report Status 08/09/2021 FINAL  Final  Blood Culture ID Panel (Reflexed)     Status: Abnormal   Collection Time: 08/06/21  7:35 PM  Result Value Ref Range Status   Enterococcus faecalis NOT DETECTED NOT DETECTED Final   Enterococcus Faecium NOT DETECTED NOT DETECTED Final   Listeria monocytogenes NOT DETECTED NOT DETECTED Final   Staphylococcus species DETECTED (A) NOT DETECTED Final    Comment: CRITICAL RESULT CALLED TO, READ BACK BY AND VERIFIED WITH: PHARMD MICHELLE LILLISTON  08/08/2021 @0026  BY JW    Staphylococcus aureus (BCID) NOT DETECTED NOT DETECTED Final   Staphylococcus epidermidis DETECTED (A) NOT DETECTED Final    Comment: CRITICAL RESULT CALLED TO, READ BACK BY AND VERIFIED WITH: PHARMD MICHELLE LILLISTON  08/08/2021 @0026  BY JW    Staphylococcus lugdunensis NOT DETECTED NOT DETECTED Final   Streptococcus species NOT DETECTED NOT DETECTED Final   Streptococcus agalactiae NOT DETECTED NOT DETECTED Final   Streptococcus pneumoniae NOT DETECTED NOT DETECTED Final   Streptococcus pyogenes NOT DETECTED NOT DETECTED Final   A.calcoaceticus-baumannii NOT DETECTED NOT DETECTED Final   Bacteroides fragilis NOT DETECTED NOT  DETECTED Final   Enterobacterales NOT DETECTED NOT DETECTED Final   Enterobacter cloacae complex NOT DETECTED NOT DETECTED Final   Escherichia coli NOT DETECTED NOT DETECTED Final   Klebsiella aerogenes NOT DETECTED NOT DETECTED Final   Klebsiella oxytoca NOT DETECTED NOT DETECTED Final   Klebsiella pneumoniae NOT DETECTED NOT DETECTED Final   Proteus species NOT DETECTED NOT DETECTED Final   Salmonella species NOT DETECTED NOT DETECTED Final   Serratia marcescens NOT DETECTED NOT DETECTED Final   Haemophilus influenzae NOT DETECTED NOT DETECTED Final   Neisseria meningitidis NOT DETECTED NOT DETECTED Final   Pseudomonas aeruginosa NOT DETECTED NOT DETECTED Final   Stenotrophomonas maltophilia NOT DETECTED NOT DETECTED Final   Candida albicans NOT DETECTED NOT DETECTED Final   Candida auris NOT DETECTED NOT DETECTED Final   Candida glabrata NOT DETECTED NOT DETECTED Final   Candida krusei NOT DETECTED NOT DETECTED Final   Candida parapsilosis NOT DETECTED NOT DETECTED Final   Candida tropicalis NOT DETECTED NOT DETECTED Final   Cryptococcus neoformans/gattii NOT DETECTED NOT DETECTED Final   Methicillin resistance mecA/C NOT DETECTED NOT DETECTED Final    Comment: Performed at [facility], [address] [place], [place] [address]  Blood culture (routine x 2)     Status: None   Collection Time: 08/06/21 11:17 PM   Specimen: BLOOD  Result Value Ref Range Status   Specimen Description   Final    BLOOD BLOOD LEFT FOREARM Performed at [facility], [address] [place], [place] [address]    Special Requests   Final    BOTTLES DRAWN AEROBIC ONLY Blood Culture adequate volume Performed at [facility], [address], [place], [place] [address]    Culture   Final    NO GROWTH 5 DAYS Performed at [facility], [address] [place], [place] [address]    Report Status 08/12/2021 FINAL  Final  Gastrointestinal Panel by PCR ,  Stool     Status: None   Collection Time: 08/08/21  5:23 AM   Specimen: STOOL  Result Value Ref Range Status   Campylobacter species NOT DETECTED NOT DETECTED Final   Plesimonas shigelloides NOT DETECTED NOT DETECTED Final   Salmonella species NOT DETECTED NOT DETECTED Final   Yersinia enterocolitica NOT DETECTED NOT DETECTED Final   Vibrio species NOT DETECTED NOT DETECTED Final   Vibrio cholerae NOT DETECTED NOT DETECTED Final   Enteroaggregative E coli (EAEC) NOT DETECTED NOT DETECTED Final   Enteropathogenic E coli (EPEC) NOT DETECTED NOT DETECTED Final   Enterotoxigenic E coli (ETEC) NOT DETECTED NOT DETECTED Final   Shiga like toxin producing E coli (STEC) NOT DETECTED NOT DETECTED Final   Shigella/Enteroinvasive E coli (EIEC) NOT DETECTED NOT DETECTED Final   Cryptosporidium NOT DETECTED NOT DETECTED Final   Cyclospora cayetanensis NOT DETECTED NOT DETECTED Final   Entamoeba histolytica NOT DETECTED NOT DETECTED Final   Giardia lamblia NOT DETECTED NOT DETECTED Final   Adenovirus F40/41 NOT DETECTED NOT DETECTED Final   Astrovirus NOT DETECTED NOT DETECTED Final   Norovirus GI/GII NOT DETECTED NOT DETECTED Final   Rotavirus A NOT DETECTED NOT DETECTED Final   Sapovirus (I, II, IV, and V) NOT DETECTED NOT DETECTED Final    Comment: Performed at [facility], [address] [place], [place] [address]  C Difficile Quick Screen w PCR reflex     Status: Abnormal   Collection Time: 08/08/21  5:23 AM   Specimen: STOOL  Result Value Ref Range Status   C Diff antigen POSITIVE (A) NEGATIVE Final   C Diff toxin NEGATIVE NEGATIVE Final   C Diff interpretation Results are indeterminate. See PCR results.  Final    Comment: Performed at [facility], [address] [place], [place] [address]  C. Diff by PCR, Reflexed     Status: None   Collection Time: 08/08/21  5:23 AM  Result Value Ref Range Status   Toxigenic C. Difficile by PCR NEGATIVE NEGATIVE Final     Comment: Patient is colonized with non toxigenic C. difficile. May not need treatment unless significant symptoms are present. Performed at [facility], [address] [place], [place] [address]           Radiology Studies: No results found.      Scheduled Meds:  chlorhexidine  15 mL Mouth Rinse BID   Chlorhexidine Gluconate Cloth  6 each Topical Daily   folic acid  1 mg Oral Daily   mouth rinse  15 mL Mouth Rinse q12n4p   multivitamin with minerals  1 tablet Oral Daily   nicotine  21 mg Transdermal Daily   sodium chloride flush  10-40 mL Intracatheter Q12H   sucralfate  1 g Oral TID WC & HS   [START ON 08/20/2021] thiamine  100 mg Oral Daily   Or   [START ON 08/20/2021] thiamine  100 mg Intravenous Daily   vancomycin  125 mg Oral QID   Continuous Infusions:  famotidine (PEPCID) IV 20 mg (08/14/21 1153)   levETIRAcetam 500 mg (08/14/21 0600)   metronidazole Stopped (08/13/21 2219)   [START ON 08/15/2021] thiamine injection     thiamine injection 500 mg (08/14/21 1123)     LOS: 7 days        Kathlen Mody, MD [facility]   To contact the attending provider between 7A-7P or the covering provider during after hours  7P-7A, please log into the web site [email or web address] and access using universal [place] password for that web site. If you do not have the password, please call the hospital operator.  08/14/2021, 11:55 AM

## 2021-08-14 NOTE — Progress Notes (Signed)
PT Cancellation Note  Patient Details Name: Brandi Romero MRN: [id number] DOB: 1976-01-15   Cancelled Treatment:    Reason Eval/Treat Not Completed: Fatigue/lethargy limiting ability to participate due to mostly medication per RN. Will check back another time .  Rada Hay 08/14/2021, 12:06 PM Blanchard Kelch PT Acute Rehabilitation Services Pager [phone] Office [phone]

## 2021-08-15 LAB — GLUCOSE, CAPILLARY
Glucose-Capillary: 104 mg/dL — ABNORMAL HIGH (ref 70–99)
Glucose-Capillary: 115 mg/dL — ABNORMAL HIGH (ref 70–99)
Glucose-Capillary: 77 mg/dL (ref 70–99)
Glucose-Capillary: 79 mg/dL (ref 70–99)
Glucose-Capillary: 84 mg/dL (ref 70–99)
Glucose-Capillary: 96 mg/dL (ref 70–99)

## 2021-08-15 LAB — BASIC METABOLIC PANEL
Anion gap: 4 — ABNORMAL LOW (ref 5–15)
BUN: <5 mg/dL — ABNORMAL LOW (ref 6–20)
CO2: 26 mmol/L (ref 22–32)
Calcium: 8.6 mg/dL — ABNORMAL LOW (ref 8.9–10.3)
Chloride: 108 mmol/L (ref 98–111)
Creatinine, Ser: 0.77 mg/dL (ref 0.44–1.00)
GFR, Estimated: >60 mL/min (ref >60)
Glucose, Bld: 89 mg/dL (ref 70–99)
Potassium: 4.1 mmol/L (ref 3.5–5.1)
Sodium: 138 mmol/L (ref 135–145)

## 2021-08-15 MED ORDER — GABAPENTIN 100 MG PO CAPS
100.0000 mg | ORAL_CAPSULE | Freq: Three times a day (TID) | ORAL | Status: DC
  Administered 2021-08-15 – 2021-08-16 (×3): 100 mg via ORAL
  Filled 2021-08-15 (×3): qty 1

## 2021-08-15 NOTE — Consult Note (Addendum)
[facility] Face-to-Face Psychiatry Consult   Reason for Consult:  Agitation Referring Physician:  Dr. Karleen Hampshire Patient Identification: Brandi Romero MRN:  [id number] Principal Diagnosis: Pancolitis [facility]) Diagnosis:  Principal Problem:   Pancolitis ([place]) Active Problems:   Hypokalemia   Hypomagnesemia   QT prolongation   Hypoglycemia   Alcohol abuse   Transaminitis   Alcohol withdrawal ([place])   Acute metabolic encephalopathy   Total Time spent with patient: 1 hour  Subjective:   Brandi Romero is a 45 y.o. female patient admitted with pancolitis.  Patient has a medical history significant for seizures, alcohol abuse severe, and anemia.  Psychiatry was consulted for agitation, noncompliance demands, requiring multiple doses of Ativan and Haldol.  Patient is unable to recall most events that have transpired during this prolonged hospitalization stay.  She denies any aggressive or overt behaviors, agitation, confusion, disorientation.  She further denies any depressive symptoms, mania, paranoia, psychosis, recent trauma, and or otherwise acute psychiatric symptoms.  Patient is unable to identify any stressors, that contribute to her alcohol use, and states she socially drinks and binge drinks from time to time.  She is able to show some insight, and acknowledge the negative effects that alcohol use is having on her mental health and physical health.  Discussed with patient her current metabolic encephalopathy, pancolitis, history of GI bleed, history of chronic anemia, history of peptic ulcer disease, and seizures.  Patient denies any current outpatient psychiatric providers, and is currently not taking any psychotropic medications at this time that is prescribed by her outpatient providers.  Per chart review patient is currently prescribed Haldol 1 mg p.o. every 6 as needed for agitation (has received 2 doses in the past 24 hours), lorazepam 0.5 mg p.o. 3 times daily as needed, and on a IV thiamine taper.   She denies any current side effects, and or adverse reactions to the current medications listed above.  She further denies any current legal charges, court dates, probation, violence, and or trauma.  She also denies any suicidal ideations, homicidal ideations, and or auditory or visual hallucinations.  However patient does make a statement in regards to the nurses removing spooky figures out of her room," they remove those spooky things that would scare me at nighttime.  But I have been fine since they took them out."   On evaluation, patient is seen and assessed by this nurse practitioner.  She is alert and oriented, calm and cooperative, very soft spoken, and pleasant.  She does acknowledge my presence into the room, in which she immediately turns down the television and participates with the psychiatric evaluation. She is disheveled, weak is in disarray, and wearing a hospital gown.  Physically she does not exhibit any psychomotor agitation, there is no obvious restlessness, tremors, shaking noted nor does she appear to be fidgeting with her hands or any objects during the evaluation.  She denies having a serious alcohol problem, cravings, withdrawal symptoms, urges, alcohol use complications such as DTs and or seizures.  She further minimize her alcohol use by stating she only drinks when she is around others, social drinking and at times will binge drink but does not consider herself having a problem.  She further denies any agitation, aggression, hallucinations and or psychosis during this hospitalization.  She does appear to be proud of herself for eating and resting.  Despite her initial presentation for nausea and vomiting, and abdominal pain she denies any eating disturbances at this time.  No recent CIWA scores available, and  blood pressure is stable at this time.  She currently denies any suicidal thoughts, homicidal thoughts, hallucinations, and or psychosis.  SHe is able to contract for safety while  on the unit.   Patient will likely require increased benzodiazepine and prolonged taper as a result of her chronic alcohol use, will continue CIWA protocol at this time.  Historically patient is blood alcohol levels have ranged from low 200s to 450 with most admissions.  We will also start her on gabapentin 100 mg p.o. three times daily, for alcohol withdrawal symptoms, agitation, and anxiety related.  At this time patient denies expressing any interest in starting an SSRI for depression and/or anxiety.  She further minimizes her alcohol use stating" I do not drink because I have to drink.  I drink because I want to drink and other people are drinking around me.  I do not get the shakes when I drink.  I will have a seizure with or without alcohol, so most time is not related to my alcohol use."  Patient further denies any recent agitation, aggression, confusion, and or disorientation as noted throughout her chart extensively for the past 9 days.SHe does express interest in going to a treatment program for substance abuse and social work will be exploring options with her.  She expresses interest in a substance abuse intensive outpatient program, may be an ideal candidate for services at [facility] will update AVS to reflect at this time.   HPI:  Brandi Romero is a 45 y.o. female admitted medically for 08/06/2021  2:10 PM for pancolitis. She carries the psychiatric diagnoses of ?bipolar disorder (noted in some parts of chart, complicated by chronic heavy EtOH use), EtOH use d/o, MI w/ stent placement,  marijuana use d/o and has a past medical history of  asthma, seizures (unclear if primary seizure d/o or due to EtOH use), PUD, GI bleed, anemia, colonic polyps.Psychiatry was consulted for agitation by Dr. Karleen Hampshire.   Past Psychiatric History: Polysubstance abuse, alcohol use disorder, severe.  Patient states she drinks about 2-3 times per week, and considers a social drinking when  around others.  However she does report at times she drinks too much" binge."  She denies any history of suicide attempts, suicidal thoughts, and or nonsuicidal self-injurious behavior.  No recent inpatient psychiatric admission, last admission for psychiatric underlying illness was May 2019 when she was admitted to [facility] for alcohol intoxication.  She reports she is currently receiving outpatient treatment at a [place] substance abuse facility.  Chart review shows she has been followed monthly by a Elliot Gurney, community outreach through [employer]   Risk to Self: Denies Risk to Others Prior Inpatient Therapy: Denies Prior Outpatient Therapy: Denies  Past Medical History:  Past Medical History:  Diagnosis Date   Asthma    Seizures ([place])     Past Surgical History:  Procedure Laterality Date   CESAREAN SECTION     Family History: History reviewed. No pertinent family history. Family Psychiatric  History: Denies Social History:  Social History   Substance and Sexual Activity  Alcohol Use Yes     Social History   Substance and Sexual Activity  Drug Use Yes   Types: Marijuana    Social History   Socioeconomic History   Marital status: Single    Spouse name: Not on file   Number of children: Not on file   Years of education: Not on file   Highest education level:  Not on file  Occupational History   Not on file  Tobacco Use   Smoking status: Every Day    Types: Cigarettes   Smokeless tobacco: Never  Substance and Sexual Activity   Alcohol use: Yes   Drug use: Yes    Types: Marijuana   Sexual activity: Not on file  Other Topics Concern   Not on file  Social History Narrative   Not on file   Social Determinants of Health   Financial Resource Strain: Not on file  Food Insecurity: Not on file  Transportation Needs: Not on file  Physical Activity: Not on file  Stress: Not on file  Social Connections: Not on file   Additional Social  History:    Allergies:  No Known Allergies  Labs:  Results for orders placed or performed during the hospital encounter of 08/06/21 (from the past 48 hour(s))  Glucose, capillary     Status: None   Collection Time: 08/13/21  4:35 PM  Result Value Ref Range   Glucose-Capillary 97 70 - 99 mg/dL    Comment: Glucose reference range applies only to samples taken after fasting for at least 8 hours.  Glucose, capillary     Status: None   Collection Time: 08/13/21  9:38 PM  Result Value Ref Range   Glucose-Capillary 95 70 - 99 mg/dL    Comment: Glucose reference range applies only to samples taken after fasting for at least 8 hours.  Glucose, capillary     Status: None   Collection Time: 08/14/21  7:43 AM  Result Value Ref Range   Glucose-Capillary 85 70 - 99 mg/dL    Comment: Glucose reference range applies only to samples taken after fasting for at least 8 hours.  Glucose, capillary     Status: None   Collection Time: 08/14/21 11:53 AM  Result Value Ref Range   Glucose-Capillary 76 70 - 99 mg/dL    Comment: Glucose reference range applies only to samples taken after fasting for at least 8 hours.  Glucose, capillary     Status: Abnormal   Collection Time: 08/14/21  7:03 PM  Result Value Ref Range   Glucose-Capillary 107 (H) 70 - 99 mg/dL    Comment: Glucose reference range applies only to samples taken after fasting for at least 8 hours.  Glucose, capillary     Status: None   Collection Time: 08/14/21  8:11 PM  Result Value Ref Range   Glucose-Capillary 89 70 - 99 mg/dL    Comment: Glucose reference range applies only to samples taken after fasting for at least 8 hours.  Glucose, capillary     Status: None   Collection Time: 08/15/21 12:03 AM  Result Value Ref Range   Glucose-Capillary 96 70 - 99 mg/dL    Comment: Glucose reference range applies only to samples taken after fasting for at least 8 hours.  Basic metabolic panel     Status: Abnormal   Collection Time: 08/15/21  3:57  AM  Result Value Ref Range   Sodium 138 135 - 145 mmol/L   Potassium 4.1 3.5 - 5.1 mmol/L    Comment: DELTA CHECK NOTED   Chloride 108 98 - 111 mmol/L   CO2 26 22 - 32 mmol/L   Glucose, Bld 89 70 - 99 mg/dL    Comment: Glucose reference range applies only to samples taken after fasting for at least 8 hours.   BUN <5 (L) 6 - 20 mg/dL   Creatinine, Ser 0.77 0.44 -  1.00 mg/dL   Calcium 8.6 (L) 8.9 - 10.3 mg/dL   GFR, Estimated >40 >98 mL/min    Comment: (NOTE) Calculated using the CKD-EPI Creatinine Equation (2021)    Anion gap 4 (L) 5 - 15    Comment: Performed at [facility], [address] [place], [place] [address]  Glucose, capillary     Status: None   Collection Time: 08/15/21  4:04 AM  Result Value Ref Range   Glucose-Capillary 84 70 - 99 mg/dL    Comment: Glucose reference range applies only to samples taken after fasting for at least 8 hours.  Glucose, capillary     Status: None   Collection Time: 08/15/21  7:12 AM  Result Value Ref Range   Glucose-Capillary 77 70 - 99 mg/dL    Comment: Glucose reference range applies only to samples taken after fasting for at least 8 hours.  Glucose, capillary     Status: None   Collection Time: 08/15/21 11:32 AM  Result Value Ref Range   Glucose-Capillary 79 70 - 99 mg/dL    Comment: Glucose reference range applies only to samples taken after fasting for at least 8 hours.    Current Facility-Administered Medications  Medication Dose Route Frequency Provider Last Rate Last Admin   albuterol (PROVENTIL) (2.5 MG/3ML) 0.083% nebulizer solution 2.5 mg  2.5 mg Nebulization Q6H PRN John Giovanni, MD       chlorhexidine (PERIDEX) 0.12 % solution 15 mL  15 mL Mouth Rinse BID Rodolph Bong, MD   15 mL at 08/15/21 1113   Chlorhexidine Gluconate Cloth 2 % PADS 6 each  6 each Topical Daily Rodolph Bong, MD   6 each at 08/15/21 1000   famotidine (PEPCID) tablet 20 mg  20 mg Oral BID Danford Bad, RPH   20 mg  at 08/15/21 1112   folic acid (FOLVITE) tablet 1 mg  1 mg Oral Daily Eduard Clos, MD   1 mg at 08/15/21 1112   haloperidol (HALDOL) tablet 1 mg  1 mg Oral Q6H PRN Kathlen Mody, MD   1 mg at 08/14/21 1931   levETIRAcetam (KEPPRA) tablet 500 mg  500 mg Oral BID Danford Bad, RPH   500 mg at 08/15/21 1112   lip balm (CARMEX) ointment   Topical PRN Rodolph Bong, MD       LORazepam (ATIVAN) injection 0.5 mg  0.5 mg Intravenous Q8H PRN Rodolph Bong, MD   0.5 mg at 08/14/21 2313   MEDLINE mouth rinse  15 mL Mouth Rinse q12n4p Rodolph Bong, MD   15 mL at 08/15/21 1326   metroNIDAZOLE (FLAGYL) IVPB 500 mg  500 mg Intravenous Q12H Kathlen Mody, MD 100 mL/hr at 08/15/21 0854 500 mg at 08/15/21 0854   morphine 2 MG/ML injection 1 mg  1 mg Intravenous Q4H PRN John Giovanni, MD   1 mg at 08/15/21 1428   multivitamin with minerals tablet 1 tablet  1 tablet Oral Daily John Giovanni, MD   1 tablet at 08/15/21 1112   nicotine (NICODERM CQ - dosed in mg/24 hours) patch 21 mg  21 mg Transdermal Daily Rodolph Bong, MD   21 mg at 08/15/21 1111   sodium chloride flush (NS) 0.9 % injection 10-40 mL  10-40 mL Intracatheter Q12H Rodolph Bong, MD   10 mL at 08/14/21 1005   sodium chloride flush (NS) 0.9 % injection 10-40 mL  10-40 mL Intracatheter PRN Rodolph Bong,  MD   10 mL at 08/08/21 0416   sucralfate (CARAFATE) tablet 1 g  1 g Oral TID WC & HS Vladimir Crofts, PA-C   1 g at 08/15/21 1112   thiamine (B-1) 250 mg in sodium chloride 0.9 % 50 mL IVPB  250 mg Intravenous TID Eugenie Filler, MD 100 mL/hr at 08/15/21 1120 250 mg at 08/15/21 1120   [START ON 08/20/2021] thiamine tablet 100 mg  100 mg Oral Daily Eugenie Filler, MD       vancomycin (VANCOCIN) capsule 125 mg  125 mg Oral QID Eugenie Filler, MD   125 mg at 08/15/21 1429    Musculoskeletal: Strength & Muscle Tone: within normal limits Gait & Station: normal Patient leans:  N/A            Psychiatric Specialty Exam:  Presentation  General Appearance: Appropriate for Environment; Disheveled Eye Contact:Fair Speech:Clear and Coherent; Slow Speech Volume:Decreased Handedness:Right  Mood and Affect  Mood:Euthymic Affect:Appropriate; Congruent  Thought Process  Thought Processes:Coherent; Linear Descriptions of Associations:Intact Orientation:Full (Time, Place and Person) Thought Content:Logical History of Schizophrenia/Schizoaffective disorder:No data recorded Duration of Psychotic Symptoms:No data recorded Hallucinations:Hallucinations: None Ideas of Reference:None Suicidal Thoughts:Suicidal Thoughts: No Homicidal Thoughts:Homicidal Thoughts: No  Sensorium  Memory:Immediate Fair; Recent Fair; Remote Fair Judgment:Poor Insight:Poor  Executive Functions  Concentration:Fair Attention Span:Fair [place]  Psychomotor Activity  Psychomotor Activity:Psychomotor Activity: Normal  Assets  Assets:Communication Skills; Leisure Time; Desire for Improvement; Financial Resources/Insurance; Resilience; Social Support  Sleep  Sleep:Sleep: Fair  Physical Exam: Physical Exam ROS Blood pressure (!) 111/95, pulse (!) 101, temperature 99.1 F (37.3 C), temperature source Oral, resp. rate 20, height 5\' 5"  (1.651 m), weight 66.7 kg, SpO2 100 %. Body mass index is 24.47 kg/m.  Treatment Plan Summary: Plan Will start gabapentin 100mg  po TID. May continue Haldol 1mg  po Q6hr prn as patient appears to be psychiatrically stable at this time. There is no evidence of agitation, psychosis, or aggression in > 24 hours per clinical documentation.  Psychiatry will reassess on Monday 08/18/2021.  **Mother endorses paradoxical reaction to lorazepam, which causes her to become confused, agitated, and physically aggressive.  Chart review does appear to be consistent with mother's reports.  Please use caution when  administering IV Ativan for nausea and emesis, and make sure nursing documentation is written as per reason for use.  Please continue to document patient's behaviors, when using any as needed medication.  Disposition: No evidence of imminent risk to self or others at present.   Patient does not meet criteria for psychiatric inpatient admission. Supportive therapy provided about ongoing stressors. Refer to IOP. Discussed crisis plan, support from social network, calling 911, coming to the Emergency Department, and calling Suicide Hotline.  Suella Broad, FNP 08/15/2021 4:03 PM

## 2021-08-15 NOTE — Progress Notes (Signed)
Received report from Manuelito, Charity fundraiser. Assuming care of patient. Assessment to follow.

## 2021-08-15 NOTE — Progress Notes (Signed)
PROGRESS NOTE    Stephen Turnbaugh  HQI:[id number] DOB: [birth year] DOA: 08/06/2021 PCP: Pcp, No    Chief Complaint  Patient presents with   Generalized Body Aches    Brief Narrative:   45 year old lady with prior history of asthma, seizures, alcohol and marijuana use, peptic ulcer disease, GI bleed, anemia and colonic polyps presents to ED with 1 week history of shortness of breath, cough chest pain, nausea vomiting and abdominal pain. CT angiogram of the chest is negative for PE CTA abdomen and pelvis shows pancolitis, hepatomegaly, poorly defined uncinate process of the pancreas and nonspecific vague fat stranding along the bowel mesentery. Patient also developed alcohol withdrawal symptoms and was put on Ativan withdrawal protocol.  GI consulted and recommendations given.  Assessment & Plan:   Principal Problem:   Pancolitis (HCC) Active Problems:   Hypokalemia   Hypomagnesemia   QT prolongation   Hypoglycemia   Alcohol abuse   Transaminitis   Alcohol withdrawal (HCC)   Acute metabolic encephalopathy   Pancolitis probably leading to nausea vomiting abdominal pain and diarrhea with significant lactic acidosis from dehydration. UDS positive for THC also contributing to the nausea and vomiting. CT of the abdomen pelvis concerning for pancolitis C. difficile PCR positive for antigen and negative for toxin. Patient was started on IV Flagyl and oral vancomycin. GI consulted recommended stopping the IV Rocephin. Advance diet as tolerated and supportive care. No diarrhea, no nausea vomiting or abdominal pain at this time.  Continue to monitor. Plan to discharge her in the next 24 hours if she continues to improve.     Transaminitis probably secondary to acute alcohol intoxication. Patient currently in withdrawal symptoms and is on CIWA protocol.    QTC prolongation Improvement in the subsequent EKGs. Keep potassium around 4 and magnesium around 2.   Alcohol withdrawal  syndrome No withdrawal symptoms at this time.    Substance abuse TOC on board for resources.     Microcytic anemia probably secondary to iron and folate deficiency Supplementation added.   Acute metabolic encephalopathy probably secondary to alcohol withdrawal symptoms?  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  Patient on high-dose of IV thiamine. Continue the same. No agitation today. She wants to go home, she is refusing to go to SNF.  She is alert, and oriented and calm. She is teary and said she would liek to go home and be with her family.  She appears to be back to baseline.  Psychiatry consulted for medication assistance.  Will d/c iv Ativan and continue with haldol if she gets agitated.     Asthma no wheezing heard, continue with bronchodilators.    Peptic ulcer disease Stable.    Seizure disorder No seizures noted at this time continue with Keppra. Transition to oral Keppra today     DVT prophylaxis: (Lovenox) Code Status: (Full code Family Communication: none at bedside.  Disposition:   Status is: Inpatient  Remains inpatient appropriate because: SNF, unsafe DC       Consultants:  Psychiatry   Procedures: none.  Antimicrobials:  Antibiotics Given (last 72 hours)     Date/Time Action Medication Dose Rate   08/12/21 1706 Given   vancomycin (VANCOCIN) capsule 125 mg 125 mg    08/12/21 2259 Given   vancomycin (VANCOCIN) capsule 125 mg 125 mg    08/12/21 2300 New Bag/Given   metroNIDAZOLE (FLAGYL) IVPB 500 mg 500 mg 100 mL/hr   08/13/21 0810 New Bag/Given   metroNIDAZOLE (FLAGYL) IVPB 500 mg 500  mg 100 mL/hr   08/13/21 1139 Given   vancomycin (VANCOCIN) capsule 125 mg 125 mg    08/13/21 1623 Given  [patient sleeping]   vancomycin (VANCOCIN) capsule 125 mg 125 mg    08/13/21 1827 Given   vancomycin (VANCOCIN) capsule 125 mg 125 mg    08/13/21 2117 New Bag/Given   metroNIDAZOLE (FLAGYL) IVPB 500 mg 500 mg 100 mL/hr   08/13/21  2117 Given   vancomycin (VANCOCIN) capsule 125 mg 125 mg    08/14/21 0900 New Bag/Given   metroNIDAZOLE (FLAGYL) IVPB 500 mg 500 mg 100 mL/hr   08/14/21 1100 Given   vancomycin (VANCOCIN) capsule 125 mg 125 mg    08/14/21 1417 Given   vancomycin (VANCOCIN) capsule 125 mg 125 mg    08/14/21 1722 Given   vancomycin (VANCOCIN) capsule 125 mg 125 mg    08/14/21 1931 Given   vancomycin (VANCOCIN) capsule 125 mg 125 mg    08/14/21 1941 New Bag/Given   metroNIDAZOLE (FLAGYL) IVPB 500 mg 500 mg 100 mL/hr   08/15/21 0854 New Bag/Given   metroNIDAZOLE (FLAGYL) IVPB 500 mg 500 mg 100 mL/hr   08/15/21 1113 Given   vancomycin (VANCOCIN) capsule 125 mg 125 mg    08/15/21 1429 Given   vancomycin (VANCOCIN) capsule 125 mg 125 mg          Subjective: Alert and oriented.   Objective: Vitals:   08/14/21 0634 08/14/21 2016 08/15/21 0359 08/15/21 1405  BP: (!) 141/94 (!) 126/93 (!) 133/93 (!) 111/95  Pulse: (!) 102 (!) 106 (!) 108 (!) 101  Resp: 16 16 18 20   Temp: 98.5 F (36.9 C) 99.2 F (37.3 C) 99.2 F (37.3 C) 99.1 F (37.3 C)  TempSrc:  Oral Oral Oral  SpO2: 100% 100% 100% 100%  Weight:      Height:        Intake/Output Summary (Last 24 hours) at 08/15/2021 1616 Last data filed at 08/15/2021 1320 Gross per 24 hour  Intake 630 ml  Output --  Net 630 ml    Filed Weights   08/11/21 0425 08/13/21 0500 08/14/21 0422  Weight: 57.9 kg 66.5 kg 66.7 kg    Examination:  General exam: Appears calm and comfortable  Respiratory system: Clear to auscultation. Respiratory effort normal. Cardiovascular system: S1 & S2 heard, RRR. No JVD, No pedal edema. Gastrointestinal system: Abdomen is nondistended, soft and nontender.  Normal bowel sounds heard. Central nervous system: Alert and oriented. No focal neurological deficits. Extremities: Symmetric 5 x 5 power. Skin: No rashes, lesions or ulcers Psychiatry:  Mood & affect appropriate.       Data Reviewed: I have personally  reviewed following labs and imaging studies  CBC: Recent Labs  Lab 08/09/21 0334 08/10/21 0333 08/11/21 0313 08/12/21 0345 08/13/21 0453  WBC 5.1 6.7 7.2 8.6 8.2  NEUTROABS 3.3 4.4 4.6  --  5.1  HGB 8.5* 8.5* 7.9* 8.3* 8.4*  HCT 27.7* 27.3* 25.5* 26.7* 27.6*  MCV 74.5* 73.6* 74.6* 74.6* 75.2*  PLT 151 168 152 165 198     Basic Metabolic Panel: Recent Labs  Lab 08/09/21 0334 08/10/21 0333 08/11/21 0313 08/12/21 0345 08/13/21 0453 08/15/21 0357  NA 136 137 138 136 138 138  K 4.8 4.9 4.3 3.8 3.4* 4.1  CL 106 109 109 106 106 108  CO2 24 24 24 23 24 26   GLUCOSE 97 98 82 83 81 89  BUN <5* <5* <5* <5* <5* <5*  CREATININE 0.43* 0.41* 0.56 0.65  0.72 0.77  CALCIUM 7.7* 8.0* 8.2* 8.4* 8.5* 8.6*  MG 1.6* 1.7 2.0 1.4* 2.0  --   PHOS 1.9* 2.8  --  3.5  --   --      GFR: Estimated Creatinine Clearance: 81.6 mL/min (by C-G formula based on SCr of 0.77 mg/dL).  Liver Function Tests: Recent Labs  Lab 08/09/21 0334 08/10/21 0333 08/11/21 0313 08/12/21 0345  AST 90* 38 29  --   ALT 20 15 13   --   ALKPHOS 136* 126 116  --   BILITOT 1.5* 1.4* 1.3*  --   PROT 6.2* 6.5 6.1*  --   ALBUMIN 2.4* 2.6* 2.4* 2.5*     CBG: Recent Labs  Lab 08/14/21 2011 08/15/21 0003 08/15/21 0404 08/15/21 0712 08/15/21 1132  GLUCAP 89 96 84 77 79      Recent Results (from the past 240 hour(s))  Resp Panel by RT-PCR (Flu A&B, Covid) Nasopharyngeal Swab     Status: None   Collection Time: 08/06/21  2:15 PM   Specimen: Nasopharyngeal Swab; Nasopharyngeal(NP) swabs in vial transport medium  Result Value Ref Range Status   SARS Coronavirus 2 by RT PCR NEGATIVE NEGATIVE Final    Comment: (NOTE) SARS-CoV-2 target nucleic acids are NOT DETECTED.  The SARS-CoV-2 RNA is generally detectable in upper respiratory specimens during the acute phase of infection. The lowest concentration of SARS-CoV-2 viral copies this assay can detect is 138 copies/mL. A negative result does not preclude  SARS-Cov-2 infection and should not be used as the sole basis for treatment or other patient management decisions. A negative result may occur with  improper specimen collection/handling, submission of specimen other than nasopharyngeal swab, presence of viral mutation(s) within the areas targeted by this assay, and inadequate number of viral copies(<138 copies/mL). A negative result must be combined with clinical observations, patient history, and epidemiological information. The expected result is Negative.  Fact Sheet for Patients:  13/09/22  Fact Sheet for Healthcare Providers:  BloggerCourse.com  This test is no t yet approved or cleared by the [employer] FDA and  has been authorized for detection and/or diagnosis of SARS-CoV-2 by FDA under an Emergency Use Authorization (EUA). This EUA will remain  in effect (meaning this test can be used) for the duration of the COVID-19 declaration under Section 564(b)(1) of the Act, 21 U.S.C.section 360bbb-3(b)(1), unless the authorization is terminated  or revoked sooner.       Influenza A by PCR NEGATIVE NEGATIVE Final   Influenza B by PCR NEGATIVE NEGATIVE Final    Comment: (NOTE) The Xpert Xpress SARS-CoV-2/FLU/RSV plus assay is intended as an aid in the diagnosis of influenza from Nasopharyngeal swab specimens and should not be used as a sole basis for treatment. Nasal washings and aspirates are unacceptable for Xpert Xpress SARS-CoV-2/FLU/RSV testing.  Fact Sheet for Patients: [place]  Fact Sheet for Healthcare Providers: BloggerCourse.com  This test is not yet approved or cleared by the [employer] FDA and has been authorized for detection and/or diagnosis of SARS-CoV-2 by FDA under an Emergency Use Authorization (EUA). This EUA will remain in effect (meaning this test can be used) for the duration of  the COVID-19 declaration under Section 564(b)(1) of the Act, 21 U.S.C. section 360bbb-3(b)(1), unless the authorization is terminated or revoked.  Performed at [facility], [address] [place], [place]   Blood culture (routine x 2)     Status: Abnormal   Collection Time: 08/06/21  7:35  PM   Specimen: BLOOD  Result Value Ref Range Status   Specimen Description   Final    BLOOD BLOOD RIGHT ARM Performed at [facility], [address] [place], [place] [address]    Special Requests   Final    BOTTLES DRAWN AEROBIC AND ANAEROBIC Blood Culture adequate volume Performed at [facility], [address] [place], [place] [address]    Culture  Setup Time   Final    GRAM POSITIVE COCCI IN BOTH AEROBIC AND ANAEROBIC BOTTLES CRITICAL RESULT CALLED TO, READ BACK BY AND VERIFIED WITH: PHARMD MICHELLE LILLISTON  08/08/2021 @0026  BY JW    Culture (A)  Final    STAPHYLOCOCCUS EPIDERMIDIS THE SIGNIFICANCE OF ISOLATING THIS ORGANISM FROM A SINGLE VENIPUNCTURE CANNOT BE PREDICTED WITHOUT FURTHER CLINICAL AND CULTURE CORRELATION. SUSCEPTIBILITIES AVAILABLE ONLY ON REQUEST. Performed at [facility], [address] [place], [place]    Report Status 08/09/2021 FINAL  Final  Blood Culture ID Panel (Reflexed)     Status: Abnormal   Collection Time: 08/06/21  7:35 PM  Result Value Ref Range Status   Enterococcus faecalis NOT DETECTED NOT DETECTED Final   Enterococcus Faecium NOT DETECTED NOT DETECTED Final   Listeria monocytogenes NOT DETECTED NOT DETECTED Final   Staphylococcus species DETECTED (A) NOT DETECTED Final    Comment: CRITICAL RESULT CALLED TO, READ BACK BY AND VERIFIED WITH: PHARMD MICHELLE LILLISTON  08/08/2021 @0026  BY JW    Staphylococcus aureus (BCID) NOT DETECTED NOT DETECTED Final   Staphylococcus epidermidis DETECTED (A) NOT DETECTED Final    Comment: CRITICAL RESULT CALLED TO, READ BACK BY AND VERIFIED  WITH: PHARMD MICHELLE LILLISTON  08/08/2021 @0026  BY JW    Staphylococcus lugdunensis NOT DETECTED NOT DETECTED Final   Streptococcus species NOT DETECTED NOT DETECTED Final   Streptococcus agalactiae NOT DETECTED NOT DETECTED Final   Streptococcus pneumoniae NOT DETECTED NOT DETECTED Final   Streptococcus pyogenes NOT DETECTED NOT DETECTED Final   A.calcoaceticus-baumannii NOT DETECTED NOT DETECTED Final   Bacteroides fragilis NOT DETECTED NOT DETECTED Final   Enterobacterales NOT DETECTED NOT DETECTED Final   Enterobacter cloacae complex NOT DETECTED NOT DETECTED Final   Escherichia coli NOT DETECTED NOT DETECTED Final   Klebsiella aerogenes NOT DETECTED NOT DETECTED Final   Klebsiella oxytoca NOT DETECTED NOT DETECTED Final   Klebsiella pneumoniae NOT DETECTED NOT DETECTED Final   Proteus species NOT DETECTED NOT DETECTED Final   Salmonella species NOT DETECTED NOT DETECTED Final   Serratia marcescens NOT DETECTED NOT DETECTED Final   Haemophilus influenzae NOT DETECTED NOT DETECTED Final   Neisseria meningitidis NOT DETECTED NOT DETECTED Final   Pseudomonas aeruginosa NOT DETECTED NOT DETECTED Final   Stenotrophomonas maltophilia NOT DETECTED NOT DETECTED Final   Candida albicans NOT DETECTED NOT DETECTED Final   Candida auris NOT DETECTED NOT DETECTED Final   Candida glabrata NOT DETECTED NOT DETECTED Final   Candida krusei NOT DETECTED NOT DETECTED Final   Candida parapsilosis NOT DETECTED NOT DETECTED Final   Candida tropicalis NOT DETECTED NOT DETECTED Final   Cryptococcus neoformans/gattii NOT DETECTED NOT DETECTED Final   Methicillin resistance mecA/C NOT DETECTED NOT DETECTED Final    Comment: Performed at [facility], [address] [place], [facility] [address]  Blood culture (routine x 2)     Status: None   Collection Time: 08/06/21 11:17 PM   Specimen: BLOOD  Result Value Ref Range Status   Specimen  Description   Final    BLOOD BLOOD LEFT FOREARM Performed at  [facility], [address], [place], [place] [address]    Special Requests   Final    BOTTLES DRAWN AEROBIC ONLY Blood Culture adequate volume Performed at [facility], [address], [place], [place] [address]    Culture   Final    NO GROWTH 5 DAYS Performed at [facility], [address], [place], [place] [address]    Report Status 08/12/2021 FINAL  Final  Gastrointestinal Panel by PCR , Stool     Status: None   Collection Time: 08/08/21  5:23 AM   Specimen: STOOL  Result Value Ref Range Status   Campylobacter species NOT DETECTED NOT DETECTED Final   Plesimonas shigelloides NOT DETECTED NOT DETECTED Final   Salmonella species NOT DETECTED NOT DETECTED Final   Yersinia enterocolitica NOT DETECTED NOT DETECTED Final   Vibrio species NOT DETECTED NOT DETECTED Final   Vibrio cholerae NOT DETECTED NOT DETECTED Final   Enteroaggregative E coli (EAEC) NOT DETECTED NOT DETECTED Final   Enteropathogenic E coli (EPEC) NOT DETECTED NOT DETECTED Final   Enterotoxigenic E coli (ETEC) NOT DETECTED NOT DETECTED Final   Shiga like toxin producing E coli (STEC) NOT DETECTED NOT DETECTED Final   Shigella/Enteroinvasive E coli (EIEC) NOT DETECTED NOT DETECTED Final   Cryptosporidium NOT DETECTED NOT DETECTED Final   Cyclospora cayetanensis NOT DETECTED NOT DETECTED Final   Entamoeba histolytica NOT DETECTED NOT DETECTED Final   Giardia lamblia NOT DETECTED NOT DETECTED Final   Adenovirus F40/41 NOT DETECTED NOT DETECTED Final   Astrovirus NOT DETECTED NOT DETECTED Final   Norovirus GI/GII NOT DETECTED NOT DETECTED Final   Rotavirus A NOT DETECTED NOT DETECTED Final   Sapovirus (I, II, IV, and V) NOT DETECTED NOT DETECTED Final    Comment: Performed at [facility], [address] [place], [place] [address]  C Difficile Quick Screen w PCR reflex     Status: Abnormal   Collection Time: 08/08/21  5:23 AM   Specimen: STOOL  Result Value  Ref Range Status   C Diff antigen POSITIVE (A) NEGATIVE Final   C Diff toxin NEGATIVE NEGATIVE Final   C Diff interpretation Results are indeterminate. See PCR results.  Final    Comment: Performed at [facility], [address], [place], [place] [address]  C. Diff by PCR, Reflexed     Status: None   Collection Time: 08/08/21  5:23 AM  Result Value Ref Range Status   Toxigenic C. Difficile by PCR NEGATIVE NEGATIVE Final    Comment: Patient is colonized with non toxigenic C. difficile. May not need treatment unless significant symptoms are present. Performed at [facility], [address], [place], [place] [address]           Radiology Studies: No results found.      Scheduled Meds:  chlorhexidine  15 mL Mouth Rinse BID   Chlorhexidine Gluconate Cloth  6 each Topical Daily   famotidine  20 mg Oral BID   folic acid  1 mg Oral Daily   gabapentin  100 mg Oral TID   levETIRAcetam  500 mg Oral BID   mouth rinse  15 mL Mouth Rinse q12n4p   multivitamin with minerals  1 tablet Oral Daily   nicotine  21 mg Transdermal Daily   sodium chloride flush  10-40 mL Intracatheter Q12H   sucralfate  1 g Oral TID WC & HS   [START ON 08/20/2021] thiamine  100 mg Oral Daily   vancomycin  125 mg Oral QID   Continuous Infusions:  metronidazole 500 mg (08/15/21 0854)   thiamine injection 250 mg (08/15/21 1120)     LOS: 8 days        Kathlen Mody, MD [facility]   To contact the attending provider between 7A-7P or the covering provider during after hours 7P-7A, please log into the web site [email or web address] and access using universal [place] password for that web site. If you do not have the password, please call the hospital operator.  08/15/2021, 4:16 PM
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
## 2021-08-15 NOTE — Progress Notes (Signed)
Patient refused Central line dressing change this shift.

## 2021-08-16 LAB — GLUCOSE, CAPILLARY
Glucose-Capillary: 91 mg/dL (ref 70–99)
Glucose-Capillary: 79 mg/dL (ref 70–99)
Glucose-Capillary: 98 mg/dL (ref 70–99)

## 2021-08-16 MED ORDER — NICOTINE 21 MG/24HR TD PT24: 21.0000 mg | MEDICATED_PATCH | Freq: Every day | TRANSDERMAL | 0 refills | Status: AC

## 2021-08-16 MED ORDER — THIAMINE HCL 100 MG PO TABS
100.0000 mg | ORAL_TABLET | Freq: Every day | ORAL | 12 refills | Status: AC
Start: 2021-08-16 — End: ?

## 2021-08-16 MED ORDER — FOLIC ACID 1 MG PO TABS: 1.0000 mg | ORAL_TABLET | Freq: Every day | ORAL | 2 refills | Status: AC

## 2021-08-16 MED ORDER — GABAPENTIN 100 MG PO CAPS
100.0000 mg | ORAL_CAPSULE | Freq: Three times a day (TID) | ORAL | 1 refills | Status: AC
Start: 2021-08-16 — End: ?

## 2021-08-16 MED ORDER — METRONIDAZOLE 500 MG PO TABS: 500.0000 mg | ORAL_TABLET | Freq: Three times a day (TID) | ORAL | 0 refills | Status: AC

## 2021-08-16 MED ORDER — ADULT MULTIVITAMIN W/MINERALS CH: 1.0000 | ORAL_TABLET | Freq: Every day | ORAL | Status: AC

## 2021-08-16 MED ORDER — LEVETIRACETAM 500 MG PO TABS
500.0000 mg | ORAL_TABLET | Freq: Two times a day (BID) | ORAL | 2 refills | Status: AC
Start: 2021-08-16 — End: ?

## 2021-08-16 MED ORDER — VANCOMYCIN HCL 125 MG PO CAPS: 125.0000 mg | ORAL_CAPSULE | Freq: Four times a day (QID) | ORAL | 0 refills | Status: AC

## 2021-08-16 NOTE — Discharge Summary (Signed)
Physician Discharge Summary  Tykeisha Peer ZOX:[id number] DOB: 12-06-1975 DOA: 08/06/2021  PCP: Pcp, No  Admit date: 08/06/2021 Discharge date: 08/16/2021  Admitted From: Home Disposition:  Home   Recommendations for Outpatient Follow-up:  Follow up with PCP in 1-2 weeks Please obtain BMP/CBC in one week Please follow up with psychiatry on Tuesday, reiterated with her fiance and the patient.   Discharge Condition: stable.  CODE STATUS:full code Diet recommendation: Heart Healthy   Brief/Interim Summary: 45 year old lady with prior history of asthma, seizures, alcohol and marijuana use, peptic ulcer disease, GI bleed, anemia and colonic polyps presents to ED with 1 week history of shortness of breath, cough chest pain, nausea vomiting and abdominal pain. CT angiogram of the chest is negative for PE CTA abdomen and pelvis shows pancolitis, hepatomegaly, poorly defined uncinate process of the pancreas and nonspecific vague fat stranding along the bowel mesentery. Patient also developed alcohol withdrawal symptoms and was put on Ativan withdrawal protocol.   GI consulted and recommendations given.  Discharge Diagnoses:  Principal Problem:   Pancolitis (HCC) Active Problems:   Hypokalemia   Hypomagnesemia   QT prolongation   Hypoglycemia   Alcohol abuse   Transaminitis   Alcohol withdrawal (HCC)   Acute metabolic encephalopathy  Pancolitis probably leading to nausea vomiting abdominal pain and diarrhea with significant lactic acidosis from dehydration. UDS positive for THC also contributing to the nausea and vomiting. CT of the abdomen pelvis concerning for pancolitis C. difficile PCR positive for antigen and negative for toxin. Patient was started on IV Flagyl and oral vancomycin transitioned to oral on discharge.  GI consulted recommended stopping the IV Rocephin. Advance diet as tolerated and supportive care. No diarrhea, no nausea vomiting or abdominal pain at this time.   Continue to monitor.        Transaminitis probably secondary to acute alcohol intoxication. No withdrawal symptoms in the last 48 hours.        QTC prolongation Improvement in the subsequent EKGs. Keep potassium around 4 and magnesium around 2.     Alcohol withdrawal syndrome No withdrawal symptoms at this time.       Substance abuse TOC on board for resources.         Microcytic anemia probably secondary to iron and folate deficiency Supplementation added.     Acute metabolic encephalopathy probably secondary to alcohol withdrawal symptoms?  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  Patient on high-dose of IV thiamine. Continue the same. No agitation today. She wants to go home, she is refusing to go to SNF.  She is alert, and oriented and calm. She is teary and said she would liek to go home and be with her family.  She appears to be back to baseline.  Psychiatry consulted for medication assistance. Recommended to start on gabapentin 100 mg TID, which was prescribed on discharge.      Asthma no wheezing heard, continue with bronchodilators.       Peptic ulcer disease Stable.     Seizure disorder No seizures noted at this time continue with Keppra. Transition to oral Keppra  on discharge.    In view of deconditioning, therapy evaluations recommending SNF. But pt is refusing to go to SNF. She is alert, oriented, and has the capacity to make her own decisions. She is walking without swaying and wants to go home with her boyfriend and if we do not discharge her, she wanted to leave AMA.       Discharge Instructions  Discharge Instructions     Diet - low sodium heart healthy   Complete by: As directed    Discharge instructions   Complete by: As directed    Please follow up with PCP in one week.  Please follow up with psychiatry on Tuesday.      Allergies as of 08/16/2021   No Known Allergies      Medication List     STOP taking these  medications    losartan 25 MG tablet Commonly known as: COZAAR   potassium chloride SA 20 MEQ tablet Commonly known as: KLOR-CON   temazepam 15 MG capsule Commonly known as: RESTORIL       TAKE these medications    B-12 1000 MCG Tabs Take 1,000 mcg by mouth daily.   ferrous sulfate 324 (65 Fe) MG Tbec Take 325 mg by mouth 2 (two) times daily.   folic acid 1 MG tablet Commonly known as: FOLVITE Take 1 tablet (1 mg total) by mouth daily.   gabapentin 100 MG capsule Commonly known as: NEURONTIN Take 1 capsule (100 mg total) by mouth 3 (three) times daily.   levETIRAcetam 500 MG tablet Commonly known as: KEPPRA Take 1 tablet (500 mg total) by mouth 2 (two) times daily.   metroNIDAZOLE 500 MG tablet Commonly known as: Flagyl Take 1 tablet (500 mg total) by mouth 3 (three) times daily for 4 days.   multivitamin with minerals Tabs tablet Take 1 tablet by mouth daily. Start taking on: August 17, 2021   nicotine 21 mg/24hr patch Commonly known as: NICODERM CQ - dosed in mg/24 hours Place 1 patch (21 mg total) onto the skin daily. Start taking on: August 17, 2021   pantoprazole 40 MG tablet Commonly known as: PROTONIX Take 40 mg by mouth 2 (two) times daily.   sucralfate 1 g tablet Commonly known as: CARAFATE Take 1 g by mouth 2 (two) times daily.   thiamine 100 MG tablet Take 1 tablet (100 mg total) by mouth daily.   vancomycin 125 MG capsule Commonly known as: VANCOCIN Take 1 capsule (125 mg total) by mouth 4 (four) times daily for 4 days.        Follow-up Information      [facility] Follow up.   Why: Call for a follow up appointment. Contact information: [address] [place] [address] [phone]               No Known Allergies  Consultations: Psychiatry.    Procedures/Studies: CT Angio Chest PE W and/or Wo Contrast  Result Date: 08/06/2021 CLINICAL DATA:  PE suspected,  high prob; Abdominal pain, acute, nonlocalized. body aches , fever, nasal congestion , h/a x 1 week, c/o chest wall pain with pro cough EXAM: CT ANGIOGRAPHY CHEST CT ABDOMEN AND PELVIS WITH CONTRAST TECHNIQUE: Multidetector CT imaging of the chest was performed using the standard protocol during bolus administration of intravenous contrast. Multiplanar CT image reconstructions and MIPs were obtained to evaluate the vascular anatomy. Multidetector CT imaging of the abdomen and pelvis was performed using the standard protocol during bolus administration of intravenous contrast. CONTRAST:  OMNIPAQUE IOHEXOL 350 MG/ML SOLN COMPARISON:  None. FINDINGS: CTA CHEST FINDINGS Cardiovascular: Satisfactory opacification of the pulmonary arteries to the segmental level. No evidence of pulmonary embolism. Normal heart size. No pericardial effusion. Mediastinum/Nodes: No enlarged mediastinal, hilar, or axillary lymph nodes. Thyroid gland, trachea, and esophagus demonstrate no significant findings. Lungs/Pleura: No focal consolidation. No pulmonary nodule.  No pulmonary mass. No pleural effusion. No pneumothorax. Musculoskeletal: No chest wall abnormality. No suspicious lytic or blastic osseous lesions. No acute displaced fracture. Multilevel degenerative changes of the spine. Review of the MIP images confirms the above findings. CT ABDOMEN and PELVIS FINDINGS Hepatobiliary: The liver is enlarged measuring up to 21 cm. Slightly heterogeneous hepatic parenchyma likely due to perfusion variant. No focal liver abnormality. No gallstones, gallbladder wall thickening, or pericholecystic fluid. No biliary dilatation. Pancreas: Poorly defined uncinate process. No focal lesion. Otherwise normal pancreatic contour. No surrounding inflammatory changes. No main pancreatic ductal dilatation. Spleen: Normal in size without focal abnormality. Adrenals/Urinary Tract: No adrenal nodule bilaterally. Bilateral kidneys enhance symmetrically. No  hydronephrosis. No hydroureter. Urinary bladder wall thickening circumferentially likely due to under distension no perivesicular fat stranding. Otherwise urinary bladder is unremarkable. On delayed imaging, there is no urothelial wall thickening and there are no filling defects in the opacified portions of the bilateral collecting systems or ureters. Stomach/Bowel: Stomach is within normal limits. No evidence of small bowel wall thickening or dilatation. Diffuse low-density bowel wall thickening of the colon. Slight hyperemia of the mucosa. Similar finding of the appendix. No pneumatosis. Vascular/Lymphatic: No abdominal aorta or iliac aneurysm. Mild atherosclerotic plaque of the aorta and its branches. No abdominal, pelvic, or inguinal lymphadenopathy. Reproductive: Uterus and bilateral adnexa are unremarkable. Other: Nonspecific vague fat stranding along the bowel mesentery (5:43). No intraperitoneal free fluid. No intraperitoneal free gas. No organized fluid collection. Musculoskeletal: No abdominal wall hernia or abnormality. No suspicious lytic or blastic osseous lesions. No acute displaced fracture. Multilevel degenerative changes of the spine. Review of the MIP images confirms the above findings. IMPRESSION: 1. No pulmonary embolus. 2. No acute intrathoracic abnormality. 3. Pancolitis. 4. Poorly defined uncinate process of the pancreas. Consider correlating with lipase levels. 5. Hepatomegaly. 6. Nonspecific vague fat stranding along the bowel mesentery. Recommend attention on follow-up. 7.  Aortic Atherosclerosis (ICD10-I70.0). Electronically Signed   By: Tish Frederickson M.D.   On: 08/06/2021 18:09   CT Abdomen Pelvis W Contrast  Result Date: 08/06/2021 CLINICAL DATA:  PE suspected, high prob; Abdominal pain, acute, nonlocalized. body aches , fever, nasal congestion , h/a x 1 week, c/o chest wall pain with pro cough EXAM: CT ANGIOGRAPHY CHEST CT ABDOMEN AND PELVIS WITH CONTRAST TECHNIQUE: Multidetector  CT imaging of the chest was performed using the standard protocol during bolus administration of intravenous contrast. Multiplanar CT image reconstructions and MIPs were obtained to evaluate the vascular anatomy. Multidetector CT imaging of the abdomen and pelvis was performed using the standard protocol during bolus administration of intravenous contrast. CONTRAST:  OMNIPAQUE IOHEXOL 350 MG/ML SOLN COMPARISON:  None. FINDINGS: CTA CHEST FINDINGS Cardiovascular: Satisfactory opacification of the pulmonary arteries to the segmental level. No evidence of pulmonary embolism. Normal heart size. No pericardial effusion. Mediastinum/Nodes: No enlarged mediastinal, hilar, or axillary lymph nodes. Thyroid gland, trachea, and esophagus demonstrate no significant findings. Lungs/Pleura: No focal consolidation. No pulmonary nodule. No pulmonary mass. No pleural effusion. No pneumothorax. Musculoskeletal: No chest wall abnormality. No suspicious lytic or blastic osseous lesions. No acute displaced fracture. Multilevel degenerative changes of the spine. Review of the MIP images confirms the above findings. CT ABDOMEN and PELVIS FINDINGS Hepatobiliary: The liver is enlarged measuring up to 21 cm. Slightly heterogeneous hepatic parenchyma likely due to perfusion variant. No focal liver abnormality. No gallstones, gallbladder wall thickening, or pericholecystic fluid. No biliary dilatation. Pancreas: Poorly defined uncinate process. No focal lesion. Otherwise normal pancreatic contour. No  surrounding inflammatory changes. No main pancreatic ductal dilatation. Spleen: Normal in size without focal abnormality. Adrenals/Urinary Tract: No adrenal nodule bilaterally. Bilateral kidneys enhance symmetrically. No hydronephrosis. No hydroureter. Urinary bladder wall thickening circumferentially likely due to under distension no perivesicular fat stranding. Otherwise urinary bladder is unremarkable. On delayed imaging, there is no  urothelial wall thickening and there are no filling defects in the opacified portions of the bilateral collecting systems or ureters. Stomach/Bowel: Stomach is within normal limits. No evidence of small bowel wall thickening or dilatation. Diffuse low-density bowel wall thickening of the colon. Slight hyperemia of the mucosa. Similar finding of the appendix. No pneumatosis. Vascular/Lymphatic: No abdominal aorta or iliac aneurysm. Mild atherosclerotic plaque of the aorta and its branches. No abdominal, pelvic, or inguinal lymphadenopathy. Reproductive: Uterus and bilateral adnexa are unremarkable. Other: Nonspecific vague fat stranding along the bowel mesentery (5:43). No intraperitoneal free fluid. No intraperitoneal free gas. No organized fluid collection. Musculoskeletal: No abdominal wall hernia or abnormality. No suspicious lytic or blastic osseous lesions. No acute displaced fracture. Multilevel degenerative changes of the spine. Review of the MIP images confirms the above findings. IMPRESSION: 1. No pulmonary embolus. 2. No acute intrathoracic abnormality. 3. Pancolitis. 4. Poorly defined uncinate process of the pancreas. Consider correlating with lipase levels. 5. Hepatomegaly. 6. Nonspecific vague fat stranding along the bowel mesentery. Recommend attention on follow-up. 7.  Aortic Atherosclerosis (ICD10-I70.0). Electronically Signed   By: Tish Frederickson M.D.   On: 08/06/2021 18:09   DG Chest Portable 1 View  Result Date: 08/06/2021 CLINICAL DATA:  Body aches EXAM: PORTABLE CHEST 1 VIEW COMPARISON:  Chest x-ray 04/30/2020 FINDINGS: Heart size and mediastinal contours are within normal limits. No suspicious pulmonary opacities identified. No pleural effusion or pneumothorax visualized. No acute osseous abnormality appreciated. IMPRESSION: No acute intrathoracic process identified. Electronically Signed   By: Jannifer Hick M.D.   On: 08/06/2021 15:24   [place] EKG SITE RITE  Result Date:  08/07/2021 If Site Rite image not attached, placement could not be confirmed due to current cardiac rhythm.     Subjective: No new complaints.   Discharge Exam: Vitals:   08/15/21 1940 08/16/21 0446  BP: (!) 145/91 (!) 144/99  Pulse: (!) 101 100  Resp: 18 16  Temp: 98.6 F (37 C) 98.3 F (36.8 C)  SpO2: 100% 96%   Vitals:   08/15/21 1405 08/15/21 1940 08/16/21 0446 08/16/21 0500  BP: (!) 111/95 (!) 145/91 (!) 144/99   Pulse: (!) 101 (!) 101 100   Resp: 20 18 16    Temp: 99.1 F (37.3 C) 98.6 F (37 C) 98.3 F (36.8 C)   TempSrc: Oral Oral Oral   SpO2: 100% 100% 96%   Weight:    63.8 kg  Height:        General: Pt is alert, awake, not in acute distress Cardiovascular: RRR, S1/S2 +, no rubs, no gallops Respiratory: CTA bilaterally, no wheezing, no rhonchi Abdominal: Soft, NT, ND, bowel sounds + Extremities: no edema, no cyanosis    The results of significant diagnostics from this hospitalization (including imaging, microbiology, ancillary and laboratory) are listed below for reference.     Microbiology: Recent Results (from the past 240 hour(s))  Resp Panel by RT-PCR (Flu A&B, Covid) Nasopharyngeal Swab     Status: None   Collection Time: 08/06/21  2:15 PM   Specimen: Nasopharyngeal Swab; Nasopharyngeal(NP) swabs in vial transport medium  Result Value Ref Range Status   SARS Coronavirus 2 by RT PCR NEGATIVE NEGATIVE  Final    Comment: (NOTE) SARS-CoV-2 target nucleic acids are NOT DETECTED.  The SARS-CoV-2 RNA is generally detectable in upper respiratory specimens during the acute phase of infection. The lowest concentration of SARS-CoV-2 viral copies this assay can detect is 138 copies/mL. A negative result does not preclude SARS-Cov-2 infection and should not be used as the sole basis for treatment or other patient management decisions. A negative result may occur with  improper specimen collection/handling, submission of specimen other than nasopharyngeal  swab, presence of viral mutation(s) within the areas targeted by this assay, and inadequate number of viral copies(<138 copies/mL). A negative result must be combined with clinical observations, patient history, and epidemiological information. The expected result is Negative.  Fact Sheet for Patients:  BloggerCourse.com  Fact Sheet for Healthcare Providers:  SeriousBroker.it  This test is no t yet approved or cleared by the [place] FDA and  has been authorized for detection and/or diagnosis of SARS-CoV-2 by FDA under an Emergency Use Authorization (EUA). This EUA will remain  in effect (meaning this test can be used) for the duration of the COVID-19 declaration under Section 564(b)(1) of the Act, 21 U.S.C.section 360bbb-3(b)(1), unless the authorization is terminated  or revoked sooner.       Influenza A by PCR NEGATIVE NEGATIVE Final   Influenza B by PCR NEGATIVE NEGATIVE Final    Comment: (NOTE) The Xpert Xpress SARS-CoV-2/FLU/RSV plus assay is intended as an aid in the diagnosis of influenza from Nasopharyngeal swab specimens and should not be used as a sole basis for treatment. Nasal washings and aspirates are unacceptable for Xpert Xpress SARS-CoV-2/FLU/RSV testing.  Fact Sheet for Patients: BloggerCourse.com  Fact Sheet for Healthcare Providers: SeriousBroker.it  This test is not yet approved or cleared by the [place] FDA and has been authorized for detection and/or diagnosis of SARS-CoV-2 by FDA under an Emergency Use Authorization (EUA). This EUA will remain in effect (meaning this test can be used) for the duration of the COVID-19 declaration under Section 564(b)(1) of the Act, 21 U.S.C. section 360bbb-3(b)(1), unless the authorization is terminated or revoked.  Performed at [facility], [address] [place], [place] [address]   Blood  culture (routine x 2)     Status: Abnormal   Collection Time: 08/06/21  7:35 PM   Specimen: BLOOD  Result Value Ref Range Status   Specimen Description   Final    BLOOD BLOOD RIGHT ARM Performed at [facility], [address] [place], [place] [address]    Special Requests   Final    BOTTLES DRAWN AEROBIC AND ANAEROBIC Blood Culture adequate volume Performed at [facility], [address] [place], [place] [address]    Culture  Setup Time   Final    GRAM POSITIVE COCCI IN BOTH AEROBIC AND ANAEROBIC BOTTLES CRITICAL RESULT CALLED TO, READ BACK BY AND VERIFIED WITH: PHARMD MICHELLE LILLISTON  08/08/2021 @0026  BY JW    Culture (A)  Final    STAPHYLOCOCCUS EPIDERMIDIS THE SIGNIFICANCE OF ISOLATING THIS ORGANISM FROM A SINGLE VENIPUNCTURE CANNOT BE PREDICTED WITHOUT FURTHER CLINICAL AND CULTURE CORRELATION. SUSCEPTIBILITIES AVAILABLE ONLY ON REQUEST. Performed at [facility], [address] [place], [place]    Report Status 08/09/2021 FINAL  Final  Blood Culture ID Panel (Reflexed)     Status: Abnormal   Collection Time: 08/06/21  7:35 PM  Result Value Ref Range Status   Enterococcus faecalis NOT DETECTED NOT  DETECTED Final   Enterococcus Faecium NOT DETECTED NOT DETECTED Final   Listeria monocytogenes NOT DETECTED NOT DETECTED Final   Staphylococcus species DETECTED (A) NOT DETECTED Final    Comment: CRITICAL RESULT CALLED TO, READ BACK BY AND VERIFIED WITH: PHARMD MICHELLE LILLISTON  08/08/2021  BY JW    Staphylococcus aureus (BCID) NOT DETECTED NOT DETECTED Final   Staphylococcus epidermidis DETECTED (A) NOT DETECTED Final    Comment: CRITICAL RESULT CALLED TO, READ BACK BY AND VERIFIED WITH: PHARMD MICHELLE LILLISTON  08/08/2021  BY JW    Staphylococcus lugdunensis NOT DETECTED NOT DETECTED Final   Streptococcus species NOT DETECTED NOT DETECTED Final   Streptococcus agalactiae NOT DETECTED NOT DETECTED Final   Streptococcus  pneumoniae NOT DETECTED NOT DETECTED Final   Streptococcus pyogenes NOT DETECTED NOT DETECTED Final   A.calcoaceticus-baumannii NOT DETECTED NOT DETECTED Final   Bacteroides fragilis NOT DETECTED NOT DETECTED Final   Enterobacterales NOT DETECTED NOT DETECTED Final   Enterobacter cloacae complex NOT DETECTED NOT DETECTED Final   Escherichia coli NOT DETECTED NOT DETECTED Final   Klebsiella aerogenes NOT DETECTED NOT DETECTED Final   Klebsiella oxytoca NOT DETECTED NOT DETECTED Final   Klebsiella pneumoniae NOT DETECTED NOT DETECTED Final   Proteus species NOT DETECTED NOT DETECTED Final   Salmonella species NOT DETECTED NOT DETECTED Final   Serratia marcescens NOT DETECTED NOT DETECTED Final   Haemophilus influenzae NOT DETECTED NOT DETECTED Final   Neisseria meningitidis NOT DETECTED NOT DETECTED Final   Pseudomonas aeruginosa NOT DETECTED NOT DETECTED Final   Stenotrophomonas maltophilia NOT DETECTED NOT DETECTED Final   Candida albicans NOT DETECTED NOT DETECTED Final   Candida auris NOT DETECTED NOT DETECTED Final   Candida glabrata NOT DETECTED NOT DETECTED Final   Candida krusei NOT DETECTED NOT DETECTED Final   Candida parapsilosis NOT DETECTED NOT DETECTED Final   Candida tropicalis NOT DETECTED NOT DETECTED Final   Cryptococcus neoformans/gattii NOT DETECTED NOT DETECTED Final   Methicillin resistance mecA/C NOT DETECTED NOT DETECTED Final    Comment: Performed at [facility], [address], [place], [place] [address]  Blood culture (routine x 2)     Status: None   Collection Time: 08/06/21 11:17 PM   Specimen: BLOOD  Result Value Ref Range Status   Specimen Description   Final    BLOOD BLOOD LEFT FOREARM Performed at [facility], [address], [place], [place] [address]    Special Requests   Final    BOTTLES DRAWN AEROBIC ONLY Blood Culture adequate volume Performed at [facility], [address] [place], [place]  [address]    Culture   Final    NO GROWTH 5 DAYS Performed at [facility], [address] [place], [place] [address]    Report Status 08/12/2021 FINAL  Final  Gastrointestinal Panel by PCR , Stool     Status: None   Collection Time: 08/08/21  5:23 AM   Specimen: STOOL  Result Value Ref Range Status   Campylobacter species NOT DETECTED NOT DETECTED Final   Plesimonas shigelloides NOT DETECTED NOT DETECTED Final   Salmonella species NOT DETECTED NOT DETECTED Final   Yersinia enterocolitica NOT DETECTED NOT DETECTED Final   Vibrio species NOT DETECTED NOT DETECTED Final   Vibrio cholerae NOT DETECTED NOT DETECTED Final   Enteroaggregative E coli (EAEC) NOT DETECTED NOT DETECTED Final   Enteropathogenic E coli (EPEC) NOT DETECTED NOT DETECTED Final   Enterotoxigenic E  coli (ETEC) NOT DETECTED NOT DETECTED Final   Shiga like toxin producing E coli (STEC) NOT DETECTED NOT DETECTED Final   Shigella/Enteroinvasive E coli (EIEC) NOT DETECTED NOT DETECTED Final   Cryptosporidium NOT DETECTED NOT DETECTED Final   Cyclospora cayetanensis NOT DETECTED NOT DETECTED Final   Entamoeba histolytica NOT DETECTED NOT DETECTED Final   Giardia lamblia NOT DETECTED NOT DETECTED Final   Adenovirus F40/41 NOT DETECTED NOT DETECTED Final   Astrovirus NOT DETECTED NOT DETECTED Final   Norovirus GI/GII NOT DETECTED NOT DETECTED Final   Rotavirus A NOT DETECTED NOT DETECTED Final   Sapovirus (I, II, IV, and V) NOT DETECTED NOT DETECTED Final    Comment: Performed at [facility], [address] [place], [place] [address]  C Difficile Quick Screen w PCR reflex     Status: Abnormal   Collection Time: 08/08/21  5:23 AM   Specimen: STOOL  Result Value Ref Range Status   C Diff antigen POSITIVE (A) NEGATIVE Final   C Diff toxin NEGATIVE NEGATIVE Final   C Diff interpretation Results are indeterminate. See PCR results.  Final    Comment: Performed at [facility], [address], [place], [place] [address]  C. Diff by PCR, Reflexed     Status: None   Collection Time: 08/08/21  5:23 AM  Result Value Ref Range Status   Toxigenic C. Difficile by PCR NEGATIVE NEGATIVE Final    Comment: Patient is colonized with non toxigenic C. difficile. May not need treatment unless significant symptoms are present. Performed at [facility], [address], [place], [place] [address]      Labs: BNP (last 3 results) Recent Labs    08/06/21 1540  BNP 84.7   Basic Metabolic Panel: Recent Labs  Lab 08/10/21 0333 08/11/21 0313 08/12/21 0345 08/13/21 0453 08/15/21 0357  NA 137 138 136 138 138  K 4.9 4.3 3.8 3.4* 4.1  CL 109 109 106 106 108  CO2 24 24 23 24 26   GLUCOSE 98 82 83 81 89  BUN <5* <5* <5* <5* <5*  CREATININE 0.41* 0.56 0.65 0.72 0.77  CALCIUM 8.0* 8.2* 8.4* 8.5* 8.6*  MG 1.7 2.0 1.4* 2.0  --   PHOS 2.8  --  3.5  --   --    Liver Function Tests: Recent Labs  Lab 08/10/21 0333 08/11/21 0313 08/12/21 0345  AST 38 29  --   ALT 15 13  --   ALKPHOS 126 116  --   BILITOT 1.4* 1.3*  --   PROT 6.5 6.1*  --   ALBUMIN 2.6* 2.4* 2.5*   No results for input(s): LIPASE, AMYLASE in the last 168 hours. No results for input(s): AMMONIA in the last 168 hours. CBC: Recent Labs  Lab 08/10/21 0333 08/11/21 0313 08/12/21 0345 08/13/21 0453  WBC 6.7 7.2 8.6 8.2  NEUTROABS 4.4 4.6  --  5.1  HGB 8.5* 7.9* 8.3* 8.4*  HCT 27.3* 25.5* 26.7* 27.6*  MCV 73.6* 74.6* 74.6* 75.2*  PLT 168 152 165 198   Cardiac Enzymes: No results for input(s): CKTOTAL, CKMB, CKMBINDEX, TROPONINI in the last 168 hours. BNP: Invalid input(s): POCBNP CBG: Recent Labs  Lab 08/15/21 1620 08/15/21 1938 08/16/21 0444 08/16/21 0729 08/16/21 1134  GLUCAP 104* 115* 91 79 98   D-Dimer No results for input(s): DDIMER in the last 72 hours. Hgb A1c No results for input(s): HGBA1C in the last 72 hours. Lipid Profile No results for  input(s): CHOL, HDL, LDLCALC, TRIG,  CHOLHDL, LDLDIRECT in the last 72 hours. Thyroid function studies No results for input(s): TSH, T4TOTAL, T3FREE, THYROIDAB in the last 72 hours.  Invalid input(s): FREET3 Anemia work up No results for input(s): VITAMINB12, FOLATE, FERRITIN, TIBC, IRON, RETICCTPCT in the last 72 hours. Urinalysis    Component Value Date/Time   COLORURINE YELLOW 08/06/2021 2030   APPEARANCEUR HAZY (A) 08/06/2021 2030   LABSPEC 1.010 08/06/2021 2030   PHURINE 6.0 08/06/2021 2030   GLUCOSEU NEGATIVE 08/06/2021 2030   HGBUR TRACE (A) 08/06/2021 2030   BILIRUBINUR NEGATIVE 08/06/2021 2030   KETONESUR NEGATIVE 08/06/2021 2030   PROTEINUR TRACE (A) 08/06/2021 2030   UROBILINOGEN 0.2 05/29/2013 0004   NITRITE NEGATIVE 08/06/2021 2030   LEUKOCYTESUR NEGATIVE 08/06/2021 2030   Sepsis Labs Invalid input(s): PROCALCITONIN,  WBC,  LACTICIDVEN Microbiology Recent Results (from the past 240 hour(s))  Resp Panel by RT-PCR (Flu A&B, Covid) Nasopharyngeal Swab     Status: None   Collection Time: 08/06/21  2:15 PM   Specimen: Nasopharyngeal Swab; Nasopharyngeal(NP) swabs in vial transport medium  Result Value Ref Range Status   SARS Coronavirus 2 by RT PCR NEGATIVE NEGATIVE Final    Comment: (NOTE) SARS-CoV-2 target nucleic acids are NOT DETECTED.  The SARS-CoV-2 RNA is generally detectable in upper respiratory specimens during the acute phase of infection. The lowest concentration of SARS-CoV-2 viral copies this assay can detect is 138 copies/mL. A negative result does not preclude SARS-Cov-2 infection and should not be used as the sole basis for treatment or other patient management decisions. A negative result may occur with  improper specimen collection/handling, submission of specimen other than nasopharyngeal swab, presence of viral mutation(s) within the areas targeted by this assay, and inadequate number of viral copies(<138 copies/mL). A negative result must be combined with clinical observations,  patient history, and epidemiological information. The expected result is Negative.  Fact Sheet for Patients:  BloggerCourse.com  Fact Sheet for Healthcare Providers:  SeriousBroker.it  This test is no t yet approved or cleared by the [place] FDA and  has been authorized for detection and/or diagnosis of SARS-CoV-2 by FDA under an Emergency Use Authorization (EUA). This EUA will remain  in effect (meaning this test can be used) for the duration of the COVID-19 declaration under Section 564(b)(1) of the Act, 21 U.S.C.section 360bbb-3(b)(1), unless the authorization is terminated  or revoked sooner.       Influenza A by PCR NEGATIVE NEGATIVE Final   Influenza B by PCR NEGATIVE NEGATIVE Final    Comment: (NOTE) The Xpert Xpress SARS-CoV-2/FLU/RSV plus assay is intended as an aid in the diagnosis of influenza from Nasopharyngeal swab specimens and should not be used as a sole basis for treatment. Nasal washings and aspirates are unacceptable for Xpert Xpress SARS-CoV-2/FLU/RSV testing.  Fact Sheet for Patients: BloggerCourse.com  Fact Sheet for Healthcare Providers: SeriousBroker.it  This test is not yet approved or cleared by the [place] FDA and has been authorized for detection and/or diagnosis of SARS-CoV-2 by FDA under an Emergency Use Authorization (EUA). This EUA will remain in effect (meaning this test can be used) for the duration of the COVID-19 declaration under Section 564(b)(1) of the Act, 21 U.S.C. section 360bbb-3(b)(1), unless the authorization is terminated or revoked.  Performed at [facility], [address] [place], [place] [address]   Blood culture (routine x 2)     Status: Abnormal   Collection Time: 08/06/21  7:35 PM  Specimen: BLOOD  Result Value Ref Range Status   Specimen Description   Final    BLOOD BLOOD RIGHT  ARM Performed at [facility], [address] [place], [place] [address]    Special Requests   Final    BOTTLES DRAWN AEROBIC AND ANAEROBIC Blood Culture adequate volume Performed at [facility], [address] [place], [place] [address]    Culture  Setup Time   Final    GRAM POSITIVE COCCI IN BOTH AEROBIC AND ANAEROBIC BOTTLES CRITICAL RESULT CALLED TO, READ BACK BY AND VERIFIED WITH: PHARMD MICHELLE LILLISTON  08/08/2021 @0026  BY JW    Culture (A)  Final    STAPHYLOCOCCUS EPIDERMIDIS THE SIGNIFICANCE OF ISOLATING THIS ORGANISM FROM A SINGLE VENIPUNCTURE CANNOT BE PREDICTED WITHOUT FURTHER CLINICAL AND CULTURE CORRELATION. SUSCEPTIBILITIES AVAILABLE ONLY ON REQUEST. Performed at [facility], [address], [place], [place] [address]    Report Status 08/09/2021 FINAL  Final  Blood Culture ID Panel (Reflexed)     Status: Abnormal   Collection Time: 08/06/21  7:35 PM  Result Value Ref Range Status   Enterococcus faecalis NOT DETECTED NOT DETECTED Final   Enterococcus Faecium NOT DETECTED NOT DETECTED Final   Listeria monocytogenes NOT DETECTED NOT DETECTED Final   Staphylococcus species DETECTED (A) NOT DETECTED Final    Comment: CRITICAL RESULT CALLED TO, READ BACK BY AND VERIFIED WITH: PHARMD MICHELLE LILLISTON  08/08/2021 @0026  BY JW    Staphylococcus aureus (BCID) NOT DETECTED NOT DETECTED Final   Staphylococcus epidermidis DETECTED (A) NOT DETECTED Final    Comment: CRITICAL RESULT CALLED TO, READ BACK BY AND VERIFIED WITH: PHARMD MICHELLE LILLISTON  08/08/2021 @0026  BY JW    Staphylococcus lugdunensis NOT DETECTED NOT DETECTED Final   Streptococcus species NOT DETECTED NOT DETECTED Final   Streptococcus agalactiae NOT DETECTED NOT DETECTED Final   Streptococcus pneumoniae NOT DETECTED NOT DETECTED Final   Streptococcus pyogenes NOT DETECTED NOT DETECTED Final   A.calcoaceticus-baumannii NOT DETECTED NOT DETECTED Final   Bacteroides fragilis  NOT DETECTED NOT DETECTED Final   Enterobacterales NOT DETECTED NOT DETECTED Final   Enterobacter cloacae complex NOT DETECTED NOT DETECTED Final   Escherichia coli NOT DETECTED NOT DETECTED Final   Klebsiella aerogenes NOT DETECTED NOT DETECTED Final   Klebsiella oxytoca NOT DETECTED NOT DETECTED Final   Klebsiella pneumoniae NOT DETECTED NOT DETECTED Final   Proteus species NOT DETECTED NOT DETECTED Final   Salmonella species NOT DETECTED NOT DETECTED Final   Serratia marcescens NOT DETECTED NOT DETECTED Final   Haemophilus influenzae NOT DETECTED NOT DETECTED Final   Neisseria meningitidis NOT DETECTED NOT DETECTED Final   Pseudomonas aeruginosa NOT DETECTED NOT DETECTED Final   Stenotrophomonas maltophilia NOT DETECTED NOT DETECTED Final   Candida albicans NOT DETECTED NOT DETECTED Final   Candida auris NOT DETECTED NOT DETECTED Final   Candida glabrata NOT DETECTED NOT DETECTED Final   Candida krusei NOT DETECTED NOT DETECTED Final   Candida parapsilosis NOT DETECTED NOT DETECTED Final   Candida tropicalis NOT DETECTED NOT DETECTED Final   Cryptococcus neoformans/gattii NOT DETECTED NOT DETECTED Final   Methicillin resistance mecA/C NOT DETECTED NOT DETECTED Final    Comment: Performed at [facility], [address], [place], [place] [address]  Blood culture (routine x 2)     Status: None   Collection Time: 08/06/21 11:17 PM   Specimen: BLOOD  Result Value Ref Range Status   Specimen Description  Final    BLOOD BLOOD LEFT FOREARM Performed at [facility], [address], [place], [place] [address]    Special Requests   Final    BOTTLES DRAWN AEROBIC ONLY Blood Culture adequate volume Performed at [facility], [address], [place], [place] [address]    Culture   Final    NO GROWTH 5 DAYS Performed at [facility], [address] [place], [place] [address]    Report Status 08/12/2021 FINAL  Final  Gastrointestinal  Panel by PCR , Stool     Status: None   Collection Time: 08/08/21  5:23 AM   Specimen: STOOL  Result Value Ref Range Status   Campylobacter species NOT DETECTED NOT DETECTED Final   Plesimonas shigelloides NOT DETECTED NOT DETECTED Final   Salmonella species NOT DETECTED NOT DETECTED Final   Yersinia enterocolitica NOT DETECTED NOT DETECTED Final   Vibrio species NOT DETECTED NOT DETECTED Final   Vibrio cholerae NOT DETECTED NOT DETECTED Final   Enteroaggregative E coli (EAEC) NOT DETECTED NOT DETECTED Final   Enteropathogenic E coli (EPEC) NOT DETECTED NOT DETECTED Final   Enterotoxigenic E coli (ETEC) NOT DETECTED NOT DETECTED Final   Shiga like toxin producing E coli (STEC) NOT DETECTED NOT DETECTED Final   Shigella/Enteroinvasive E coli (EIEC) NOT DETECTED NOT DETECTED Final   Cryptosporidium NOT DETECTED NOT DETECTED Final   Cyclospora cayetanensis NOT DETECTED NOT DETECTED Final   Entamoeba histolytica NOT DETECTED NOT DETECTED Final   Giardia lamblia NOT DETECTED NOT DETECTED Final   Adenovirus F40/41 NOT DETECTED NOT DETECTED Final   Astrovirus NOT DETECTED NOT DETECTED Final   Norovirus GI/GII NOT DETECTED NOT DETECTED Final   Rotavirus A NOT DETECTED NOT DETECTED Final   Sapovirus (I, II, IV, and V) NOT DETECTED NOT DETECTED Final    Comment: Performed at [facility], [address] [place], [place] [address]  C Difficile Quick Screen w PCR reflex     Status: Abnormal   Collection Time: 08/08/21  5:23 AM   Specimen: STOOL  Result Value Ref Range Status   C Diff antigen POSITIVE (A) NEGATIVE Final   C Diff toxin NEGATIVE NEGATIVE Final   C Diff interpretation Results are indeterminate. See PCR results.  Final    Comment: Performed at [facility], [address] [place], [place] [address]  C. Diff by PCR, Reflexed     Status: None   Collection Time: 08/08/21  5:23 AM  Result Value Ref Range Status   Toxigenic C. Difficile by PCR NEGATIVE  NEGATIVE Final    Comment: Patient is colonized with non toxigenic C. difficile. May not need treatment unless significant symptoms are present. Performed at [facility], [address] [place], [place] [address]      Time coordinating discharge:39 minutes.   SIGNED:   Kathlen Mody, MD  [facility] 08/16/2021, 1:26 PM

## 2021-08-16 NOTE — Progress Notes (Signed)
Dr Blake Divine at bedside discussing discharge instructions and conversation that she had with patient's mother. Pt informed that Dr Blake Divine was completing discharge order, discharge instructions and prescriptions. Patient was instructed to stay until discharge paperwork was completed and reviewed with her. Patient got on the elevator and said she already has medications at home. Discharge instructions, education and prescriptions were not reviewed prior to patient leaving the hospital. Dr Blake Divine notified.

## 2022-02-13 ENCOUNTER — Emergency Department (HOSPITAL_BASED_OUTPATIENT_CLINIC_OR_DEPARTMENT_OTHER)
Admission: EM | Admit: 2022-02-13 | Discharge: 2022-02-14 | Disposition: A | Discharge status: Home / Self Care | Payer: Medicaid Other | Attending: Emergency Medicine | Admitting: Emergency Medicine

## 2022-02-13 ENCOUNTER — Encounter (HOSPITAL_BASED_OUTPATIENT_CLINIC_OR_DEPARTMENT_OTHER): Payer: Self-pay

## 2022-02-13 ENCOUNTER — Other Ambulatory Visit: Payer: Self-pay

## 2022-02-13 DIAGNOSIS — K2920 Alcoholic gastritis without bleeding: Secondary | ICD-10-CM

## 2022-02-13 DIAGNOSIS — E860 Dehydration: Secondary | ICD-10-CM | POA: Diagnosis not present

## 2022-02-13 DIAGNOSIS — Y908 Blood alcohol level of 240 mg/100 ml or more: Secondary | ICD-10-CM | POA: Insufficient documentation

## 2022-02-13 DIAGNOSIS — F1010 Alcohol abuse, uncomplicated: Secondary | ICD-10-CM

## 2022-02-13 DIAGNOSIS — J45909 Unspecified asthma, uncomplicated: Secondary | ICD-10-CM | POA: Diagnosis not present

## 2022-02-13 DIAGNOSIS — K7031 Alcoholic cirrhosis of liver with ascites: Secondary | ICD-10-CM

## 2022-02-13 DIAGNOSIS — R109 Unspecified abdominal pain: Secondary | ICD-10-CM | POA: Diagnosis present

## 2022-02-13 HISTORY — DX: Unspecified cirrhosis of liver: K74.60

## 2022-02-13 HISTORY — DX: Alcohol abuse, uncomplicated: F10.10

## 2022-02-13 LAB — COMPREHENSIVE METABOLIC PANEL
ALT: 13 U/L (ref 0–44)
AST: 33 U/L (ref 15–41)
Albumin: 2.1 g/dL — ABNORMAL LOW (ref 3.5–5.0)
Alkaline Phosphatase: 119 U/L (ref 38–126)
Anion gap: 9 (ref 5–15)
BUN: 23 mg/dL — ABNORMAL HIGH (ref 6–20)
CO2: 24 mmol/L (ref 22–32)
Calcium: 7.8 mg/dL — ABNORMAL LOW (ref 8.9–10.3)
Chloride: 103 mmol/L (ref 98–111)
Creatinine, Ser: 1.47 mg/dL — ABNORMAL HIGH (ref 0.44–1.00)
GFR, Estimated: 45 mL/min — ABNORMAL LOW (ref >60)
Glucose, Bld: 102 mg/dL — ABNORMAL HIGH (ref 70–99)
Potassium: 4 mmol/L (ref 3.5–5.1)
Sodium: 136 mmol/L (ref 135–145)
Total Bilirubin: 0.5 mg/dL (ref 0.3–1.2)
Total Protein: 5.4 g/dL — ABNORMAL LOW (ref 6.5–8.1)

## 2022-02-13 LAB — CBC
HCT: 31.1 % — ABNORMAL LOW (ref 36.0–46.0)
Hemoglobin: 10.2 g/dL — ABNORMAL LOW (ref 12.0–15.0)
MCH: 23.6 pg — ABNORMAL LOW (ref 26.0–34.0)
MCHC: 32.8 g/dL (ref 30.0–36.0)
MCV: 72 fL — ABNORMAL LOW (ref 80.0–100.0)
Platelets: 392 10*3/uL (ref 150–400)
RBC: 4.32 MIL/uL (ref 3.87–5.11)
RDW: 20.4 % — ABNORMAL HIGH (ref 11.5–15.5)
WBC: 9 10*3/uL (ref 4.0–10.5)
nRBC: 0 % (ref 0.0–0.2)

## 2022-02-13 LAB — ETHANOL: Alcohol, Ethyl (B): 277 mg/dL — ABNORMAL HIGH (ref <10)

## 2022-02-13 LAB — LIPASE, BLOOD: Lipase: 25 U/L (ref 11–51)

## 2022-02-13 LAB — CBG MONITORING, ED: Glucose-Capillary: 106 mg/dL — ABNORMAL HIGH (ref 70–99)

## 2022-02-13 MED ORDER — ONDANSETRON 4 MG PO TBDP
4.0000 mg | ORAL_TABLET | Freq: Once | ORAL | Status: AC
  Administered 2022-02-14: 4 mg via ORAL
  Filled 2022-02-13: qty 1

## 2022-02-13 MED ORDER — FAMOTIDINE IN NACL 20-0.9 MG/50ML-% IV SOLN
20.0000 mg | Freq: Once | INTRAVENOUS | Status: AC
  Administered 2022-02-14: 20 mg via INTRAVENOUS
  Filled 2022-02-13: qty 50

## 2022-02-13 MED ORDER — LACTATED RINGERS IV BOLUS
1000.0000 mL | Freq: Once | INTRAVENOUS | Status: AC
  Administered 2022-02-14: 1000 mL via INTRAVENOUS

## 2022-02-13 NOTE — ED Triage Notes (Signed)
Unable to obtain labs during triage. Pt has difficult venous access.

## 2022-02-13 NOTE — ED Triage Notes (Addendum)
Pt c/o abd pain that started last night. Pt had a paracentesis yesterday. Pt reports associated hematemesis today. Pt is a poor historian. Pt with hx of cirrhosis. Pt states she drank ETOH today which made her abd pain worse.

## 2022-02-13 NOTE — ED Provider Notes (Incomplete)
[facility] EMERGENCY DEPARTMENT Provider Note   CSN: [id number] Arrival date & time: 02/13/22  2115     History {Add pertinent medical, surgical, social history, OB history to HPI:1} Chief Complaint  Patient presents with   Abdominal Pain    Brandi Romero is a 46 y.o. female.   Patient as above with significant medical history as below, including alcoholic cirrhosis, frequent alcohol abuse, seizure on Keppra, asthma who presents to the ED with complaint of abdominal pain.  Patient reports she was "hanging in with some friends" last night, earlier today and was consuming large amounts of alcohol.  She began to have abdominal pain and vomiting following the consumption of alcohol.  Having some streaks of blood mixed with her vomit.  Pain described as squeezing, cramping sensation.  No medications prior to arrival for symptom relief.  Last p.o. intake was prior to consumption of alcohol, around 24 hours ago.  Has not been drinking liquids the past 12 hours secondary to upset stomach and vomiting.  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  No fevers.     Past Medical History: No date: Alcohol abuse No date: Asthma No date: Cirrhosis (HCC) No date: Seizures [facility])  Past Surgical History: No date: CESAREAN SECTION    The history is provided by the patient. No language interpreter was used.  Abdominal Pain Associated symptoms: diarrhea, fatigue, nausea and vomiting   Associated symptoms: no chest pain, no chills, no cough, no fever, no hematuria and no shortness of breath       Home Medications Prior to Admission medications   Medication Sig Start Date End Date Taking? Authorizing Provider  Cyanocobalamin (B-12) 1000 MCG TABS Take 1,000 mcg by mouth daily. 05/09/21   [provider]  ferrous sulfate 324 (65 Fe) MG TBEC Take 325 mg by mouth 2 (two) times daily. 05/09/21   [provider]  folic acid (FOLVITE) 1  MG tablet Take 1 tablet (1 mg total) by mouth daily. 08/16/21   Kathlen Mody, MD  gabapentin (NEURONTIN) 100 MG capsule Take 1 capsule (100 mg total) by mouth 3 (three) times daily. 08/16/21   Kathlen Mody, MD  levETIRAcetam (KEPPRA) 500 MG tablet Take 1 tablet (500 mg total) by mouth 2 (two) times daily. 08/16/21   Kathlen Mody, MD  Multiple Vitamin (MULTIVITAMIN WITH MINERALS) TABS tablet Take 1 tablet by mouth daily. 08/17/21   Kathlen Mody, MD  nicotine (NICODERM CQ - DOSED IN MG/24 HOURS) 21 mg/24hr patch Place 1 patch (21 mg total) onto the skin daily. 08/17/21   Kathlen Mody, MD  pantoprazole (PROTONIX) 40 MG tablet Take 40 mg by mouth 2 (two) times daily. 05/09/21   [provider]  sucralfate (CARAFATE) 1 g tablet Take 1 g by mouth 2 (two) times daily. 05/09/21   [provider]  thiamine 100 MG tablet Take 1 tablet (100 mg total) by mouth daily. 08/16/21   Kathlen Mody, MD      Allergies    Patient has no known allergies.    Review of Systems   Review of Systems  Constitutional:  Positive for fatigue. Negative for chills and fever.  HENT:  Negative for facial swelling and trouble swallowing.   Eyes:  Negative for photophobia and visual disturbance.  Respiratory:  Negative for cough and shortness of breath.   Cardiovascular:  Negative for chest pain and palpitations.  Gastrointestinal:  Positive for abdominal pain, diarrhea, nausea and vomiting.  Streaks of blood in vomit  Endocrine: Negative for polydipsia and polyuria.  Genitourinary:  Negative for difficulty urinating and hematuria.  Musculoskeletal:  Negative for gait problem and joint swelling.  Skin:  Negative for pallor and rash.  Neurological:  Negative for syncope and headaches.  Psychiatric/Behavioral:  Negative for agitation and confusion.    Physical Exam Updated Vital Signs BP 96/64 (BP Location: Right Arm)   Pulse (!) 102   Temp 98.4 F (36.9 C)   Resp 18   Ht 5\' 5"  (1.651 m)   Wt  54.4 kg   SpO2 95%   BMI 19.97 kg/m  Physical Exam Vitals and nursing note reviewed.  Constitutional:      General: She is not in acute distress.    Appearance: Normal appearance.  HENT:     Head: Normocephalic and atraumatic.     Right Ear: External ear normal.     Left Ear: External ear normal.     Nose: Nose normal.     Mouth/Throat:     Mouth: Mucous membranes are moist.  Eyes:     General: No scleral icterus.       Right eye: No discharge.        Left eye: No discharge.  Cardiovascular:     Rate and Rhythm: Normal rate and regular rhythm.     Pulses: Normal pulses.     Heart sounds: Normal heart sounds.  Pulmonary:     Effort: Pulmonary effort is normal. No respiratory distress.     Breath sounds: Normal breath sounds.  Abdominal:     General: Abdomen is flat. There is distension.     Tenderness: There is generalized abdominal tenderness. There is guarding.     Comments: Mild abdominal distention.  Bedside ultrasound with mild amount of ascites; no easily accessible fluid collection for possible drainage  Musculoskeletal:        General: Normal range of motion.     Cervical back: Normal range of motion.     Right lower leg: No edema.     Left lower leg: No edema.  Skin:    General: Skin is warm and dry.     Capillary Refill: Capillary refill takes less than 2 seconds.  Neurological:     Mental Status: She is alert.  Psychiatric:        Mood and Affect: Mood normal.        Behavior: Behavior normal.    ED Results / Procedures / Treatments   Labs (all labs ordered are listed, but only abnormal results are displayed) Labs Reviewed  COMPREHENSIVE METABOLIC PANEL - Abnormal; Notable for the following components:      Result Value   Glucose, Bld 102 (*)    BUN 23 (*)    Creatinine, Ser 1.47 (*)    Calcium 7.8 (*)    Total Protein 5.4 (*)    Albumin 2.1 (*)    GFR, Estimated 45 (*)    All other components within normal limits  CBC - Abnormal; Notable for the  following components:   Hemoglobin 10.2 (*)    HCT 31.1 (*)    MCV 72.0 (*)    MCH 23.6 (*)    RDW 20.4 (*)    All other components within normal limits  ETHANOL - Abnormal; Notable for the following components:   Alcohol, Ethyl (B) 277 (*)    All other components within normal limits  CBG MONITORING, ED - Abnormal; Notable for the following components:  Glucose-Capillary 106 (*)    All other components within normal limits  LIPASE, BLOOD  URINALYSIS, ROUTINE W REFLEX MICROSCOPIC  PREGNANCY, URINE    EKG None  Radiology No results found.  Procedures Procedures  {Document cardiac monitor, telemetry assessment procedure when appropriate:1}  Medications Ordered in ED Medications  lactated ringers bolus 1,000 mL (has no administration in time range)  famotidine (PEPCID) IVPB 20 mg premix (has no administration in time range)    ED Course/ Medical Decision Making/ A&P                           Medical Decision Making Amount and/or Complexity of Data Reviewed Labs: ordered. ECG/medicine tests: ordered.  Risk Prescription drug management.    CC: Abdominal pain, nausea and vomiting  This patient presents to the Emergency Department for the above complaint. This involves an extensive number of treatment options and is a complaint that carries with it a high risk of complications and morbidity. Vital signs were reviewed. Serious etiologies considered.  DDx includes not limited to, gastritis, esophagitis, enteritis, foodborne illness, alcohol intoxication, alcohol withdrawal, SBP, viral syndrome, biliary colic, UTI, renal colic  Record review:  Previous records obtained and reviewed  Recent hospitalizations, recent ED visits, prior labs and imaging  Additional history obtained from N/A  Medical and surgical history as noted above.   Work up as above, notable for:  Labs & imaging results that were available during my care of the patient were visualized by me and  considered in my medical decision making.   I ordered imaging studies which included CT a/p without contrast and I visualized the imaging and I agree with radiologist interpretation. ***  Labs reviewed, creatinine increased from baseline, today is 1.47, prior 0.6-0.7, liver enzymes not elevated.  Alcohol is elevated 277.  Cardiac monitoring reviewed and interpreted personally which shows NSR    Management: GI cocktail, IV fluids.  I discussed at length alcohol cessation with the patient.  She reports that she does not intend to quit drinking at this time but will at some point in the future.  Reassessment:  ***                 Social determinants of health include -  Chronic etoh abuse, tobacco abuse, frequent admissions/ed visits  Counseled patient for approximately 4 minutes regarding smoking cessation. Discussed risks of smoking and how they applied and affected their visit here today. Patient not ready to quit at this time, however will follow up with their primary doctor when they are.   CPT code: [address]: intermediate counseling for smoking cessation   Social History   Socioeconomic History   Marital status: Single    Spouse name: Not on file   Number of children: Not on file   Years of education: Not on file   Highest education level: Not on file  Occupational History   Not on file  Tobacco Use   Smoking status: Every Day    Types: Cigarettes   Smokeless tobacco: Never  Substance and Sexual Activity   Alcohol use: Yes   Drug use: Yes    Types: Marijuana   Sexual activity: Not on file  Other Topics Concern   Not on file  Social History Narrative   Not on file   Social Determinants of Health   Financial Resource Strain: Not on file  Food Insecurity: Not on file  Transportation Needs: Not on file  Physical  Activity: Not on file  Stress: Not on file  Social Connections: Not on file  Intimate Partner Violence: Not on file      This chart  was dictated using voice recognition software.  Despite best efforts to proofread,  errors can occur which can change the documentation meaning.   {Document critical care time when appropriate:1} {Document review of labs and clinical decision tools ie heart score, Chads2Vasc2 etc:1}  {Document your independent review of radiology images, and any outside records:1} {Document your discussion with family members, caretakers, and with consultants:1} {Document social determinants of health affecting pt's care:1} {Document your decision making why or why not admission, treatments were needed:1} Final Clinical Impression(s) / ED Diagnoses Final diagnoses:  None    Rx / DC Orders ED Discharge Orders     None

## 2022-02-14 ENCOUNTER — Emergency Department (HOSPITAL_BASED_OUTPATIENT_CLINIC_OR_DEPARTMENT_OTHER): Payer: Medicaid Other

## 2022-02-14 LAB — URINALYSIS, ROUTINE W REFLEX MICROSCOPIC
Bilirubin Urine: NEGATIVE
Glucose, UA: NEGATIVE mg/dL
Hgb urine dipstick: NEGATIVE
Ketones, ur: NEGATIVE mg/dL
Leukocytes,Ua: NEGATIVE
Nitrite: NEGATIVE
Protein, ur: NEGATIVE mg/dL
Specific Gravity, Urine: 1.02 (ref 1.005–1.030)
pH: 5.5 (ref 5.0–8.0)

## 2022-02-14 LAB — MAGNESIUM: Magnesium: 1.7 mg/dL (ref 1.7–2.4)

## 2022-02-14 LAB — PREGNANCY, URINE: Preg Test, Ur: NEGATIVE

## 2022-02-14 LAB — AMMONIA: Ammonia: 10 umol/L (ref 9–35)

## 2022-02-14 MED ORDER — SUCRALFATE 1 G PO TABS: 1.0000 g | ORAL_TABLET | Freq: Three times a day (TID) | ORAL | 0 refills | Status: AC

## 2022-02-14 MED ORDER — MORPHINE SULFATE (PF) 4 MG/ML IV SOLN
4.0000 mg | Freq: Once | INTRAVENOUS | Status: AC
  Administered 2022-02-14: 4 mg via INTRAVENOUS
  Filled 2022-02-14: qty 1

## 2022-02-14 MED ORDER — ONDANSETRON HCL 4 MG PO TABS: 4.0000 mg | ORAL_TABLET | Freq: Three times a day (TID) | ORAL | 0 refills | Status: AC | PRN

## 2022-02-14 MED ORDER — PANTOPRAZOLE SODIUM 40 MG PO TBEC: 40.0000 mg | DELAYED_RELEASE_TABLET | Freq: Every day | ORAL | 0 refills | Status: AC

## 2022-02-14 NOTE — Discharge Instructions (Addendum)
Please stop consuming alcohol, please stop using tobacco products  Please continue taking folic acid and thiamine supplements  It was a pleasure caring for you today in the emergency department.  Please return to the emergency department for any worsening or worrisome symptoms.

## 2022-02-14 NOTE — ED Notes (Signed)
Pt currently refusing to be stuck again for INT // MD made aware

## 2022-02-14 NOTE — ED Notes (Signed)
Patient transported to CT 

## 2022-02-14 NOTE — ED Notes (Signed)
Attempted ultrasound IV X 1 without success. Patient not completely cooperative. Physician notified.

## 2022-02-14 NOTE — ED Notes (Signed)
Pt has refused the EKG.

## 2022-02-16 LAB — LEVETIRACETAM LEVEL: Levetiracetam Lvl: 23.2 ug/mL (ref 10.0–40.0)

## 2022-04-28 DEATH — deceased

## 2023-05-31 IMAGING — CT CT ABD-PELV W/ CM
2 of 8 series · 14 of 46 positions shown, 16 images · IV contrast (Omnipaque)
Comparison: None.

CLINICAL DATA: PE suspected, high prob; Abdominal pain, acute,
nonlocalized. body aches , fever, nasal congestion , h/a x 1 week,
c/o chest wall pain with pro cough

EXAM:
CT ANGIOGRAPHY CHEST
CT ABDOMEN AND PELVIS WITH CONTRAST
TECHNIQUE: Multidetector CT imaging of the chest was performed using the
standard protocol during bolus administration of intravenous
contrast. Multiplanar CT image reconstructions and MIPs were
obtained to evaluate the vascular anatomy. Multidetector CT imaging
of the abdomen and pelvis was performed using the standard protocol
during bolus administration of intravenous contrast.
CONTRAST:  100mL OMNIPAQUE IOHEXOL 350 MG/ML SOLN

[Series 5: axial st · axial · 0.82mm/px · z∈[-729,-284]mm · 11 of 101 slices shown, 13 images]
[im 6/101  soft-tissue]
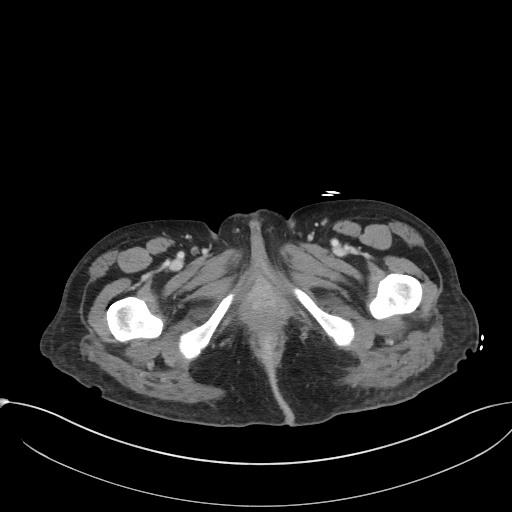
[im 6/101  bone]
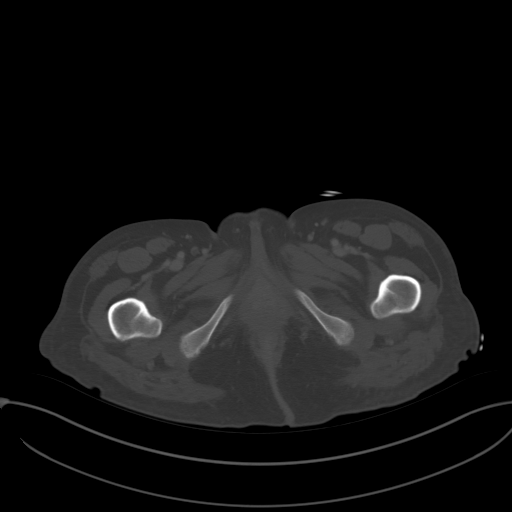
[im 17/101  soft-tissue]
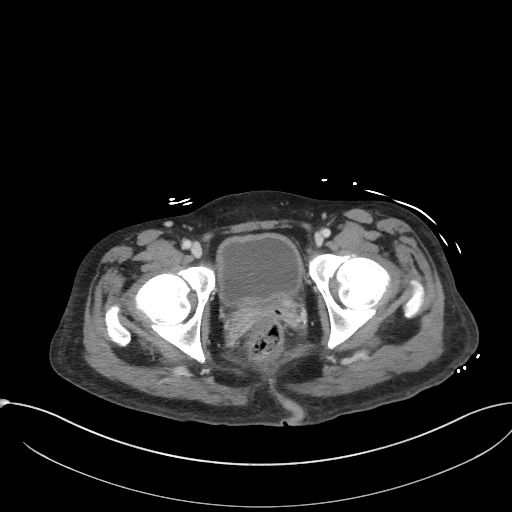
[im 23/101  soft-tissue]
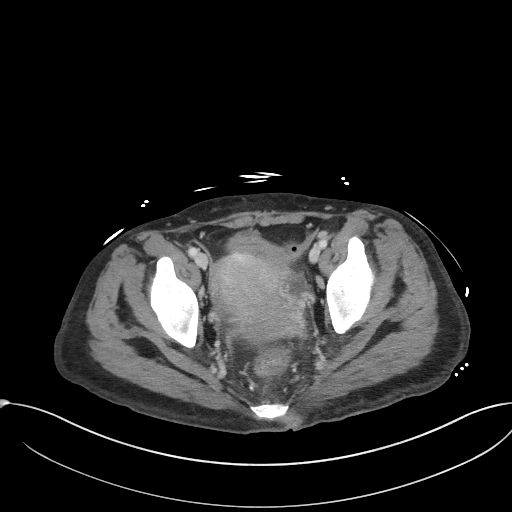
[im 34/101  soft-tissue]
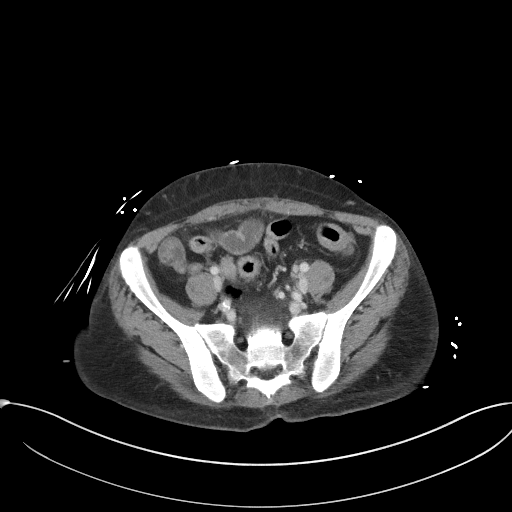
[im 39/101  soft-tissue]
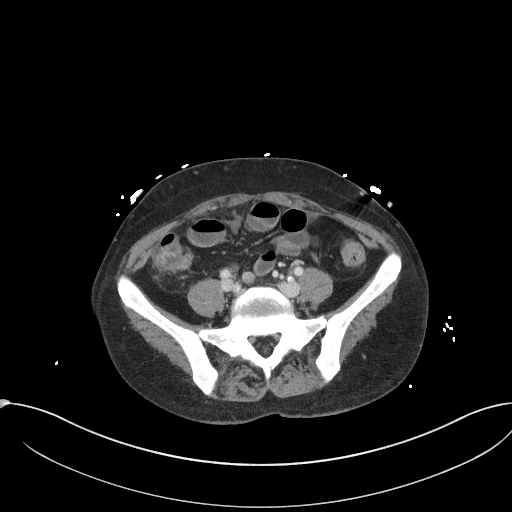
[im 51/101  soft-tissue]
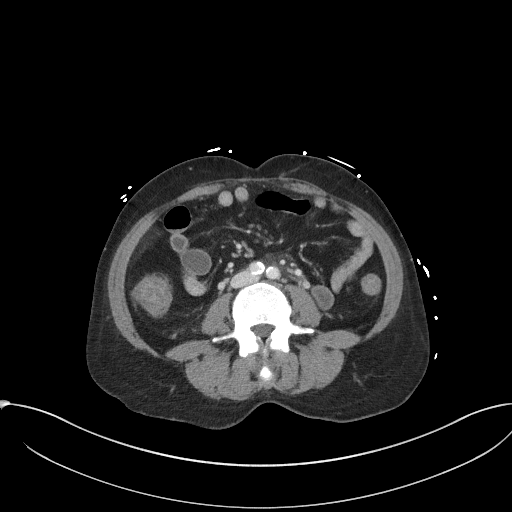
[im 62/101  soft-tissue]
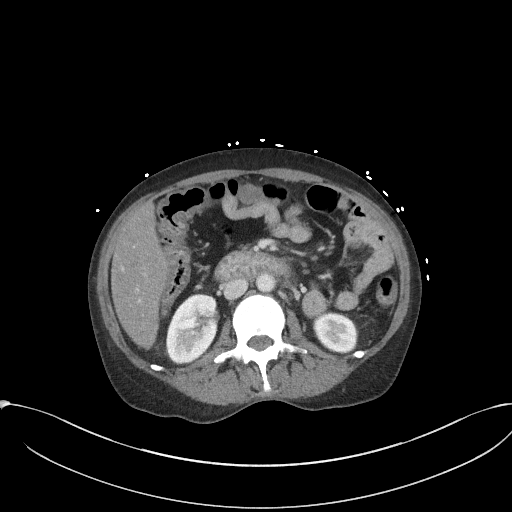
[im 67/101  soft-tissue]
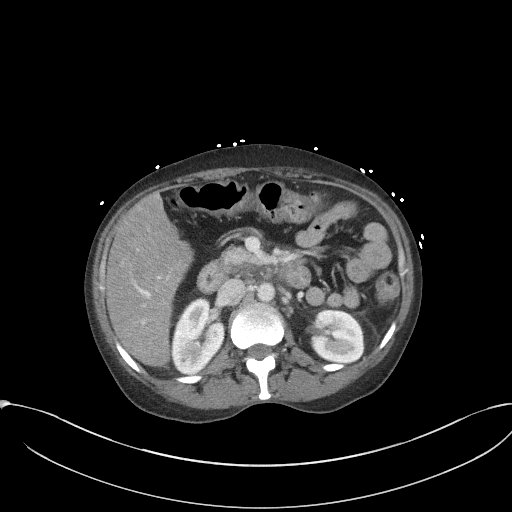
[im 78/101  soft-tissue]
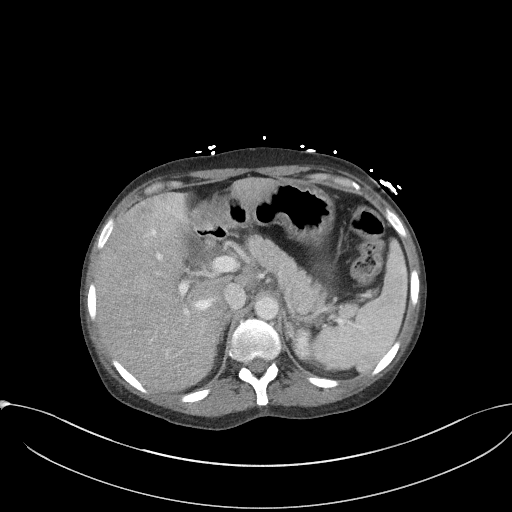
[im 78/101  bone]
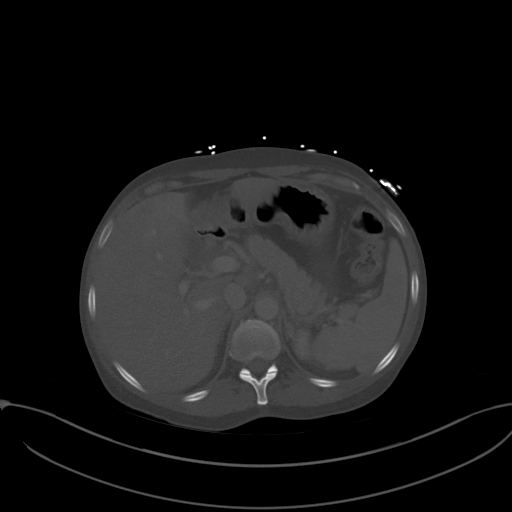
[im 84/101  soft-tissue]
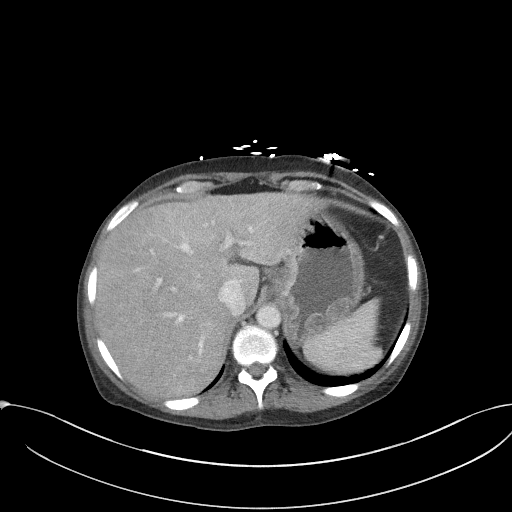
[im 95/101  soft-tissue]
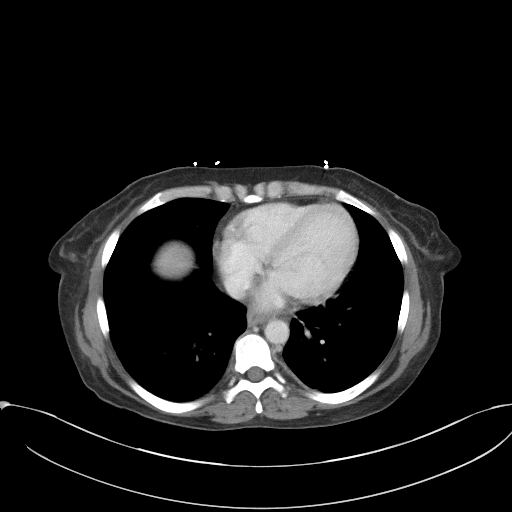

[Series 8: abd/pel coronal st · coronal · 0.68mm/px · 3 of 90 slices shown]
[im 30/90  soft-tissue]
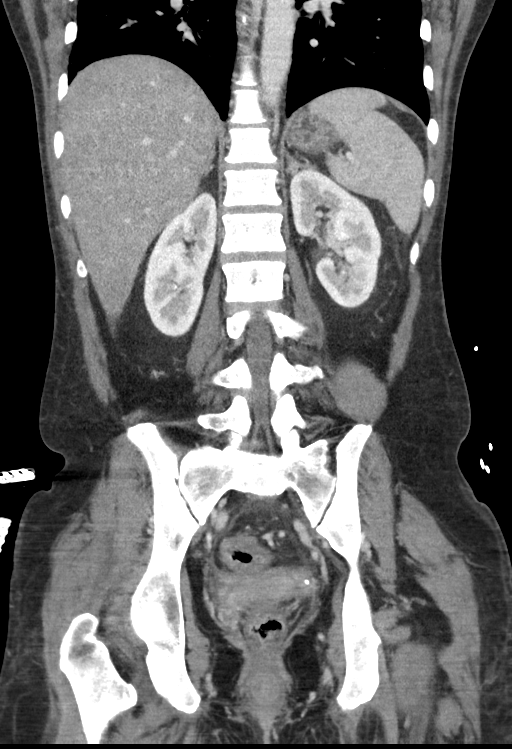
[im 40/90  soft-tissue]
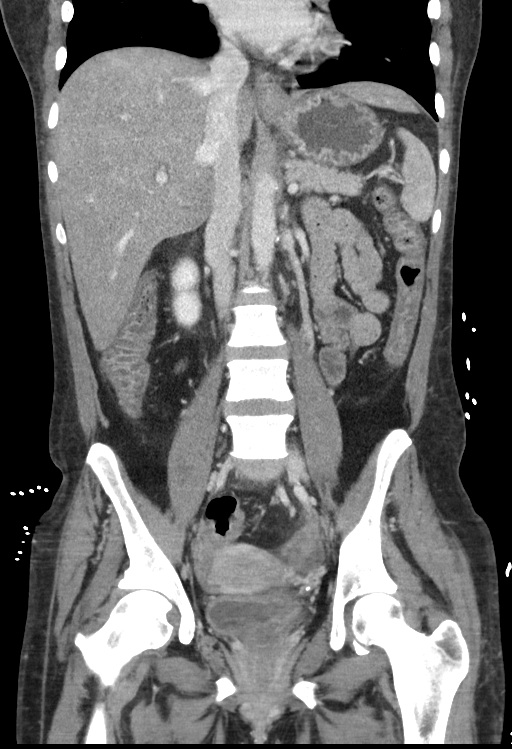
[im 50/90  soft-tissue]
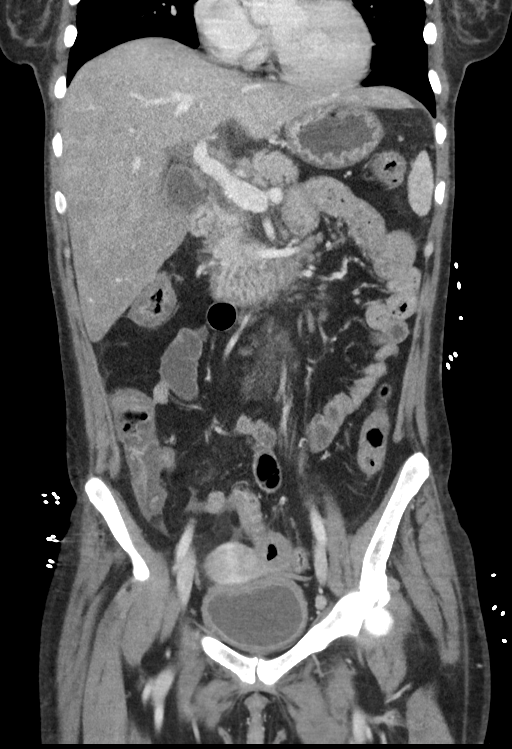

[14 of 46 positions shown; findings below may reference images not displayed]

FINDINGS: CTA CHEST FINDINGS

Cardiovascular: Satisfactory opacification of the pulmonary arteries
to the segmental level. No evidence of pulmonary embolism. Normal
heart size. No pericardial effusion.

Mediastinum/Nodes: No enlarged mediastinal, hilar, or axillary lymph
nodes. Thyroid gland, trachea, and esophagus demonstrate no
significant findings.

Lungs/Pleura: No focal consolidation. No pulmonary nodule. No
pulmonary mass. No pleural effusion. No pneumothorax.

Musculoskeletal:

No chest wall abnormality.

No suspicious lytic or blastic osseous lesions. No acute displaced
fracture. Multilevel degenerative changes of the spine.

Review of the MIP images confirms the above findings.

CT ABDOMEN and PELVIS FINDINGS

Hepatobiliary: The liver is enlarged measuring up to 21 cm. Slightly
heterogeneous hepatic parenchyma likely due to perfusion variant. No
focal liver abnormality. No gallstones, gallbladder wall thickening,
or pericholecystic fluid. No biliary dilatation.

Pancreas: Poorly defined uncinate process. No focal lesion.
Otherwise normal pancreatic contour. No surrounding inflammatory
changes. No main pancreatic ductal dilatation.

Spleen: Normal in size without focal abnormality.

Adrenals/Urinary Tract:

No adrenal nodule bilaterally.

Bilateral kidneys enhance symmetrically.

No hydronephrosis. No hydroureter.

Urinary bladder wall thickening circumferentially likely due to
under distension no perivesicular fat stranding. Otherwise urinary
bladder is unremarkable.

On delayed imaging, there is no urothelial wall thickening and there
are no filling defects in the opacified portions of the bilateral
collecting systems or ureters.

Stomach/Bowel: Stomach is within normal limits. No evidence of small
bowel wall thickening or dilatation. Diffuse low-density bowel wall
thickening of the colon. Slight hyperemia of the mucosa. Similar
finding of the appendix. No pneumatosis.

Vascular/Lymphatic: No abdominal aorta or iliac aneurysm. Mild
atherosclerotic plaque of the aorta and its branches. No abdominal,
pelvic, or inguinal lymphadenopathy.

Reproductive: Uterus and bilateral adnexa are unremarkable.

Other: Nonspecific vague fat stranding along the bowel mesentery
([DATE]). No intraperitoneal free fluid. No intraperitoneal free gas.
No organized fluid collection.

Musculoskeletal:

No abdominal wall hernia or abnormality.

No suspicious lytic or blastic osseous lesions. No acute displaced
fracture. Multilevel degenerative changes of the spine.

Review of the MIP images confirms the above findings.
IMPRESSION: 1. No pulmonary embolus.
2. No acute intrathoracic abnormality.
3. Pancolitis.
4. Poorly defined uncinate process of the pancreas. Consider
correlating with lipase levels.
5. Hepatomegaly.
6. Nonspecific vague fat stranding along the bowel mesentery.
Recommend attention on follow-up.
7.  Aortic Atherosclerosis (IGVFY-BHD.D).

## 2023-05-31 IMAGING — CT CT ANGIO CHEST
2 of 6 series · 17 of 36 positions shown · IV contrast (Omnipaque)
Comparison: None.

CLINICAL DATA: PE suspected, high prob; Abdominal pain, acute,
nonlocalized. body aches , fever, nasal congestion , h/a x 1 week,
c/o chest wall pain with pro cough

EXAM:
CT ANGIOGRAPHY CHEST
CT ABDOMEN AND PELVIS WITH CONTRAST
TECHNIQUE: Multidetector CT imaging of the chest was performed using the
standard protocol during bolus administration of intravenous
contrast. Multiplanar CT image reconstructions and MIPs were
obtained to evaluate the vascular anatomy. Multidetector CT imaging
of the abdomen and pelvis was performed using the standard protocol
during bolus administration of intravenous contrast.
CONTRAST:  100mL OMNIPAQUE IOHEXOL 350 MG/ML SOLN

[Series 4: pe thins · axial · 0.70mm/px · z∈[-366,-129]mm · 16 of 265 slices shown]
[im 14/265  lung]
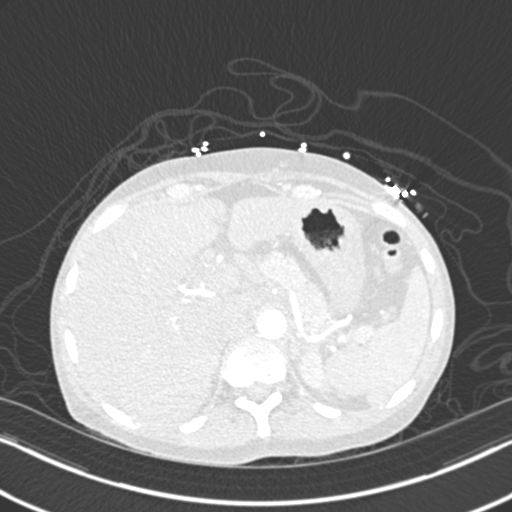
[im 27/265  mediastinal]
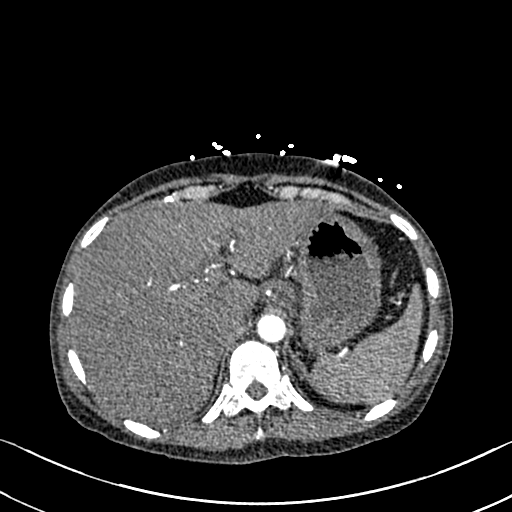
[im 40/265  lung]
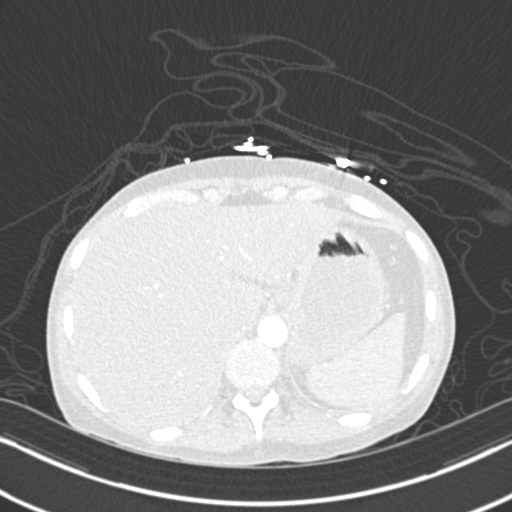
[im 67/265  mediastinal]
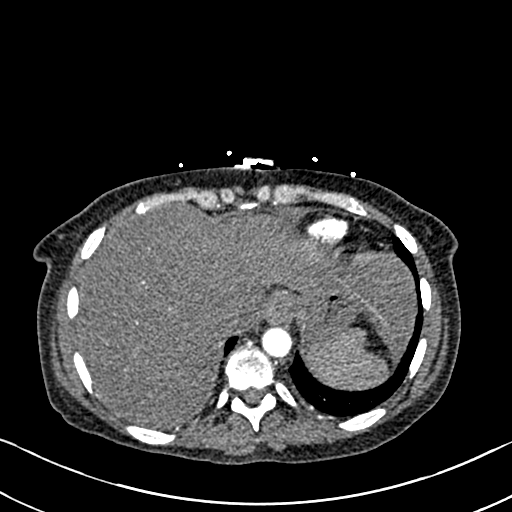
[im 80/265  lung]
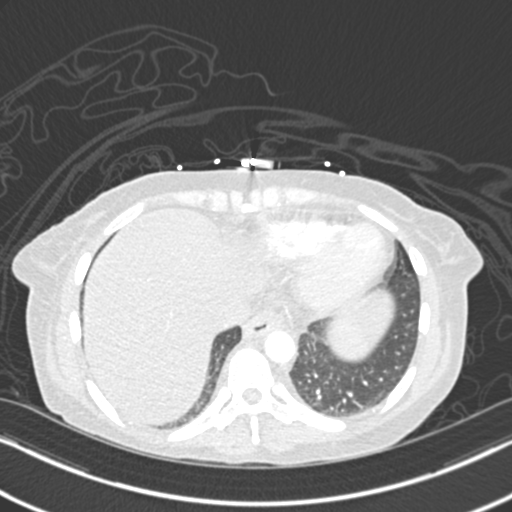
[im 93/265  mediastinal]
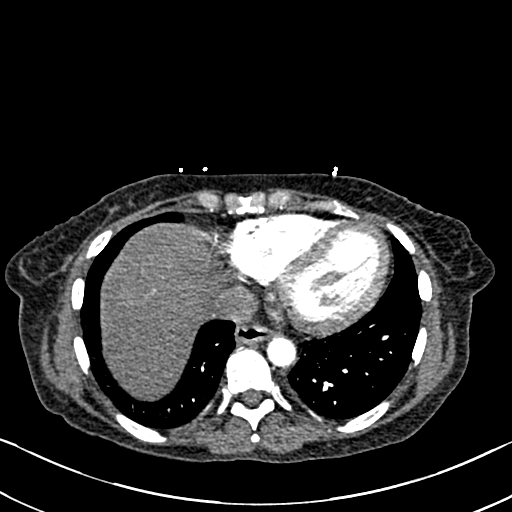
[im 106/265  lung]
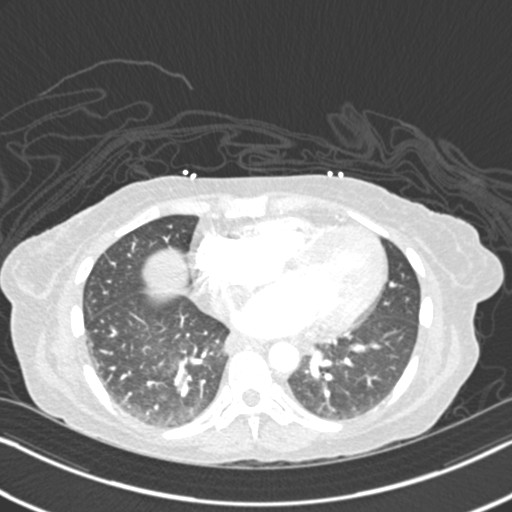
[im 119/265  mediastinal]
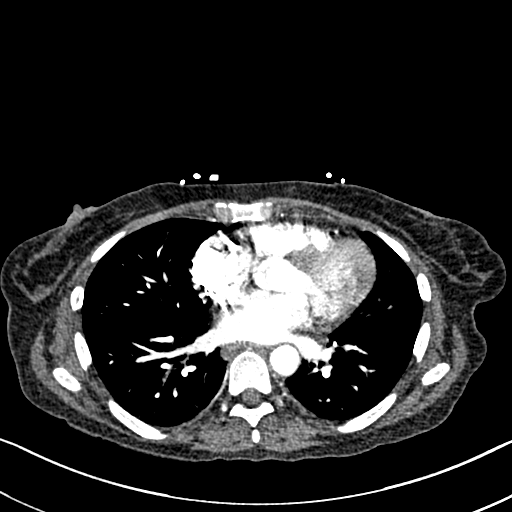
[im 146/265  lung]
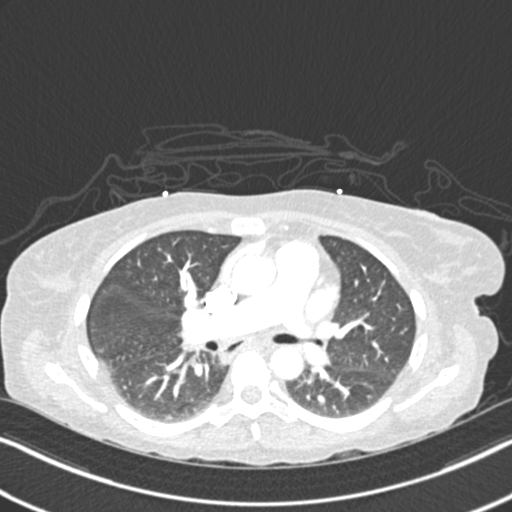
[im 159/265  mediastinal]
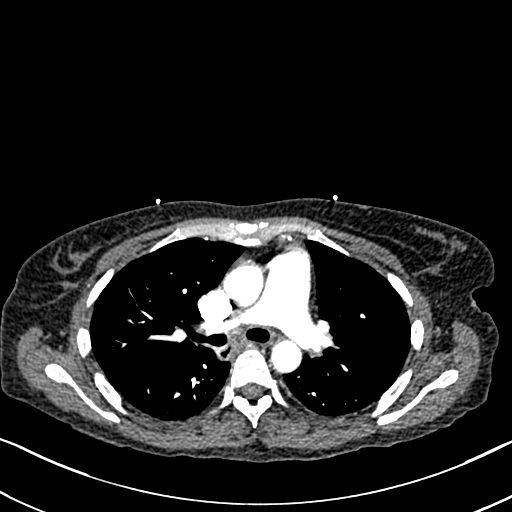
[im 172/265  lung]
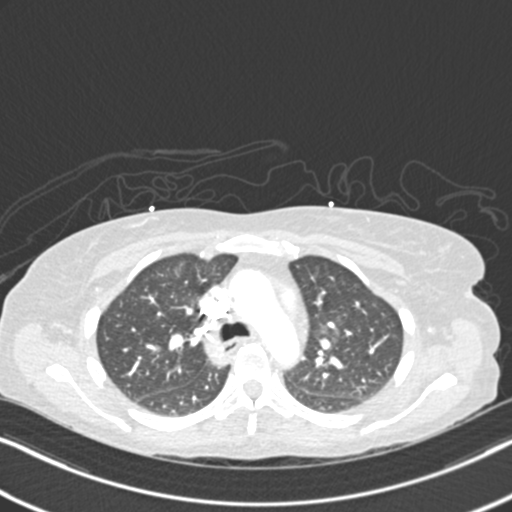
[im 185/265  mediastinal]
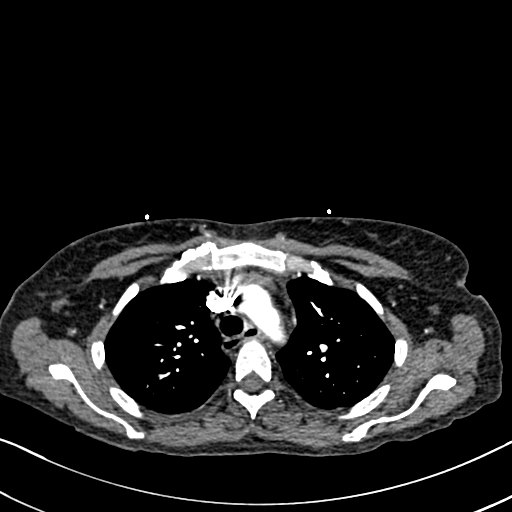
[im 199/265  lung]
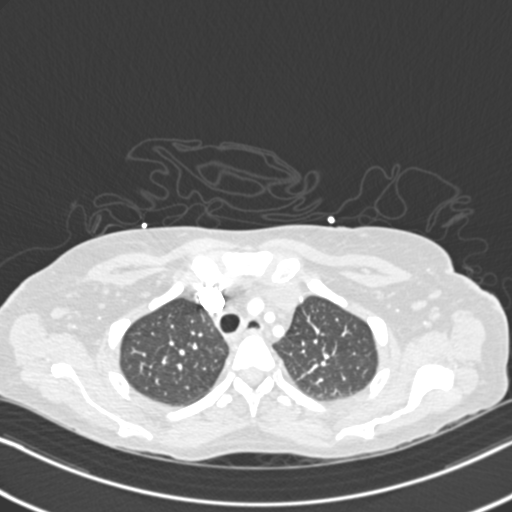
[im 225/265  mediastinal]
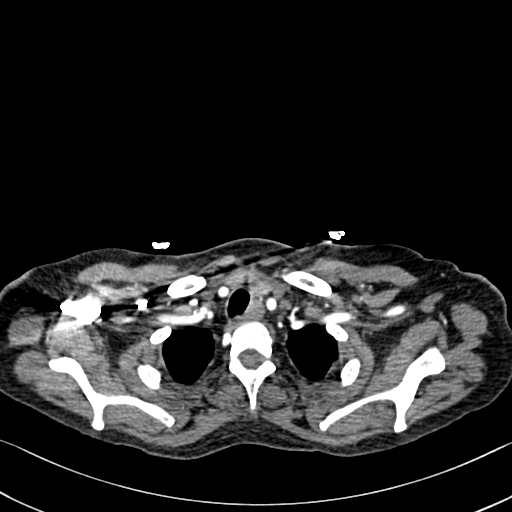
[im 238/265  lung]
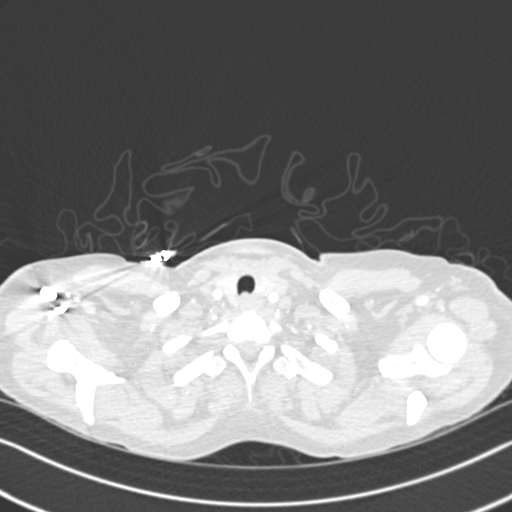
[im 251/265  mediastinal]
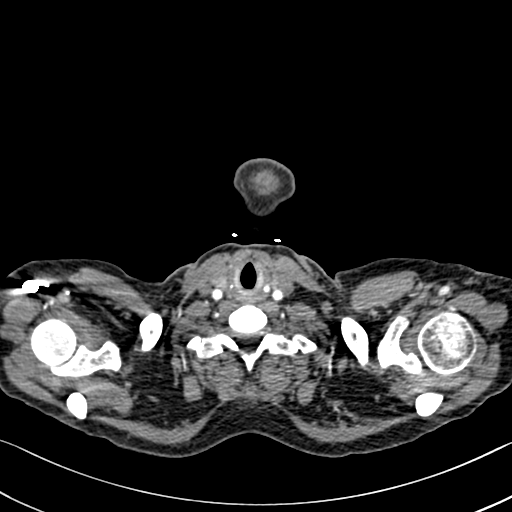

[Series 5: pe coronal mpr · coronal · 0.54mm/px · 1 of 129 slices shown]
[im 65/129  mediastinal]
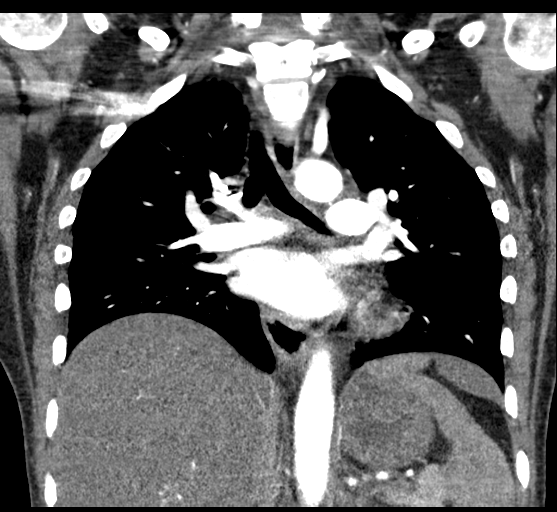

[17 of 36 positions shown; findings below may reference images not displayed]

FINDINGS: CTA CHEST FINDINGS

Cardiovascular: Satisfactory opacification of the pulmonary arteries
to the segmental level. No evidence of pulmonary embolism. Normal
heart size. No pericardial effusion.

Mediastinum/Nodes: No enlarged mediastinal, hilar, or axillary lymph
nodes. Thyroid gland, trachea, and esophagus demonstrate no
significant findings.

Lungs/Pleura: No focal consolidation. No pulmonary nodule. No
pulmonary mass. No pleural effusion. No pneumothorax.

Musculoskeletal:

No chest wall abnormality.

No suspicious lytic or blastic osseous lesions. No acute displaced
fracture. Multilevel degenerative changes of the spine.

Review of the MIP images confirms the above findings.

CT ABDOMEN and PELVIS FINDINGS

Hepatobiliary: The liver is enlarged measuring up to 21 cm. Slightly
heterogeneous hepatic parenchyma likely due to perfusion variant. No
focal liver abnormality. No gallstones, gallbladder wall thickening,
or pericholecystic fluid. No biliary dilatation.

Pancreas: Poorly defined uncinate process. No focal lesion.
Otherwise normal pancreatic contour. No surrounding inflammatory
changes. No main pancreatic ductal dilatation.

Spleen: Normal in size without focal abnormality.

Adrenals/Urinary Tract:

No adrenal nodule bilaterally.

Bilateral kidneys enhance symmetrically.

No hydronephrosis. No hydroureter.

Urinary bladder wall thickening circumferentially likely due to
under distension no perivesicular fat stranding. Otherwise urinary
bladder is unremarkable.

On delayed imaging, there is no urothelial wall thickening and there
are no filling defects in the opacified portions of the bilateral
collecting systems or ureters.

Stomach/Bowel: Stomach is within normal limits. No evidence of small
bowel wall thickening or dilatation. Diffuse low-density bowel wall
thickening of the colon. Slight hyperemia of the mucosa. Similar
finding of the appendix. No pneumatosis.

Vascular/Lymphatic: No abdominal aorta or iliac aneurysm. Mild
atherosclerotic plaque of the aorta and its branches. No abdominal,
pelvic, or inguinal lymphadenopathy.

Reproductive: Uterus and bilateral adnexa are unremarkable.

Other: Nonspecific vague fat stranding along the bowel mesentery
([DATE]). No intraperitoneal free fluid. No intraperitoneal free gas.
No organized fluid collection.

Musculoskeletal:

No abdominal wall hernia or abnormality.

No suspicious lytic or blastic osseous lesions. No acute displaced
fracture. Multilevel degenerative changes of the spine.

Review of the MIP images confirms the above findings.
IMPRESSION: 1. No pulmonary embolus.
2. No acute intrathoracic abnormality.
3. Pancolitis.
4. Poorly defined uncinate process of the pancreas. Consider
correlating with lipase levels.
5. Hepatomegaly.
6. Nonspecific vague fat stranding along the bowel mesentery.
Recommend attention on follow-up.
7.  Aortic Atherosclerosis (IGVFY-BHD.D).
